# Patient Record
Sex: Female | Born: 1935
Health system: Southern US, Community
[De-identification: ages and names within clinical notes are randomized; demographics above are authoritative.]

## PROBLEM LIST (undated history)

## (undated) DIAGNOSIS — H3530 Unspecified macular degeneration: Secondary | ICD-10-CM

## (undated) DIAGNOSIS — I2699 Other pulmonary embolism without acute cor pulmonale: Secondary | ICD-10-CM

## (undated) DIAGNOSIS — H547 Unspecified visual loss: Secondary | ICD-10-CM

## (undated) DIAGNOSIS — N200 Calculus of kidney: Secondary | ICD-10-CM

## (undated) DIAGNOSIS — I8390 Asymptomatic varicose veins of unspecified lower extremity: Secondary | ICD-10-CM

## (undated) DIAGNOSIS — T8859XA Other complications of anesthesia, initial encounter: Secondary | ICD-10-CM

## (undated) DIAGNOSIS — M1990 Unspecified osteoarthritis, unspecified site: Secondary | ICD-10-CM

## (undated) DIAGNOSIS — Z9889 Other specified postprocedural states: Secondary | ICD-10-CM

## (undated) DIAGNOSIS — T4145XA Adverse effect of unspecified anesthetic, initial encounter: Secondary | ICD-10-CM

## (undated) DIAGNOSIS — R112 Nausea with vomiting, unspecified: Secondary | ICD-10-CM

## (undated) HISTORY — PX: EYE SURGERY: SHX253

## (undated) HISTORY — PX: KNEE ARTHROSCOPY: SUR90

---

## 2005-01-19 ENCOUNTER — Ambulatory Visit (HOSPITAL_COMMUNITY): Admission: RE | Admit: 2005-01-19 | Discharge: 2005-01-19 | Payer: Self-pay | Admitting: Family Medicine

## 2005-01-24 ENCOUNTER — Ambulatory Visit: Payer: Self-pay | Admitting: *Deleted

## 2005-02-07 ENCOUNTER — Ambulatory Visit (HOSPITAL_COMMUNITY): Admission: RE | Admit: 2005-02-07 | Discharge: 2005-02-07 | Payer: Self-pay | Admitting: *Deleted

## 2005-02-07 ENCOUNTER — Ambulatory Visit: Payer: Self-pay | Admitting: *Deleted

## 2005-02-07 ENCOUNTER — Ambulatory Visit: Payer: Self-pay | Admitting: Cardiology

## 2005-02-15 ENCOUNTER — Ambulatory Visit: Payer: Self-pay | Admitting: *Deleted

## 2005-02-21 ENCOUNTER — Ambulatory Visit (HOSPITAL_COMMUNITY): Admission: RE | Admit: 2005-02-21 | Discharge: 2005-02-21 | Payer: Self-pay | Admitting: *Deleted

## 2005-02-21 ENCOUNTER — Ambulatory Visit: Payer: Self-pay | Admitting: *Deleted

## 2005-02-22 ENCOUNTER — Ambulatory Visit: Payer: Self-pay | Admitting: *Deleted

## 2009-07-15 DIAGNOSIS — H3530 Unspecified macular degeneration: Secondary | ICD-10-CM | POA: Insufficient documentation

## 2009-07-15 DIAGNOSIS — R079 Chest pain, unspecified: Secondary | ICD-10-CM | POA: Insufficient documentation

## 2011-04-05 ENCOUNTER — Emergency Department (HOSPITAL_COMMUNITY): Payer: Medicare Other

## 2011-04-05 ENCOUNTER — Encounter (HOSPITAL_COMMUNITY): Payer: Self-pay | Admitting: Radiology

## 2011-04-05 ENCOUNTER — Emergency Department (HOSPITAL_COMMUNITY)
Admission: EM | Admit: 2011-04-05 | Discharge: 2011-04-06 | Disposition: A | Discharge status: Home / Self Care | Payer: Medicare Other | Attending: Emergency Medicine | Admitting: Emergency Medicine

## 2011-04-05 DIAGNOSIS — R197 Diarrhea, unspecified: Secondary | ICD-10-CM | POA: Insufficient documentation

## 2011-04-05 DIAGNOSIS — R5383 Other fatigue: Secondary | ICD-10-CM | POA: Insufficient documentation

## 2011-04-05 DIAGNOSIS — R5381 Other malaise: Secondary | ICD-10-CM | POA: Insufficient documentation

## 2011-04-05 DIAGNOSIS — N12 Tubulo-interstitial nephritis, not specified as acute or chronic: Secondary | ICD-10-CM | POA: Insufficient documentation

## 2011-04-05 DIAGNOSIS — I10 Essential (primary) hypertension: Secondary | ICD-10-CM | POA: Insufficient documentation

## 2011-04-05 DIAGNOSIS — R109 Unspecified abdominal pain: Secondary | ICD-10-CM | POA: Insufficient documentation

## 2011-04-05 DIAGNOSIS — R112 Nausea with vomiting, unspecified: Secondary | ICD-10-CM | POA: Insufficient documentation

## 2011-04-05 DIAGNOSIS — R350 Frequency of micturition: Secondary | ICD-10-CM | POA: Insufficient documentation

## 2011-04-05 LAB — DIFFERENTIAL
Eosinophils Relative: 0 % (ref 0–5)
Lymphocytes Relative: 14 % (ref 12–46)
Lymphs Abs: 1.3 10*3/uL (ref 0.7–4.0)
Monocytes Relative: 7 % (ref 3–12)

## 2011-04-05 LAB — URINALYSIS, ROUTINE W REFLEX MICROSCOPIC
Glucose, UA: NEGATIVE mg/dL
Protein, ur: 30 mg/dL — AB
Specific Gravity, Urine: 1.02 (ref 1.005–1.030)
pH: 6 (ref 5.0–8.0)

## 2011-04-05 LAB — CBC
Platelets: 195 10*3/uL (ref 150–400)
RBC: 4.54 MIL/uL (ref 3.87–5.11)
WBC: 9.1 10*3/uL (ref 4.0–10.5)

## 2011-04-05 LAB — URINE MICROSCOPIC-ADD ON

## 2011-04-05 LAB — COMPREHENSIVE METABOLIC PANEL
ALT: 7 U/L (ref 0–35)
BUN: 10 mg/dL (ref 6–23)
CO2: 32 mEq/L (ref 19–32)
Calcium: 9.8 mg/dL (ref 8.4–10.5)
Creatinine, Ser: 1.04 mg/dL (ref 0.4–1.2)
GFR calc Af Amer: >60 mL/min (ref >60)
GFR calc non Af Amer: 52 mL/min — ABNORMAL LOW (ref >60)
Glucose, Bld: 108 mg/dL — ABNORMAL HIGH (ref 70–99)
Sodium: 136 mEq/L (ref 135–145)
Total Protein: 8.1 g/dL (ref 6.0–8.3)

## 2011-04-05 LAB — LIPASE, BLOOD: Lipase: 13 U/L (ref 11–59)

## 2011-04-05 MED ORDER — IOHEXOL 300 MG/ML  SOLN
100.0000 mL | Freq: Once | INTRAMUSCULAR | Status: AC | PRN
  Administered 2011-04-05: 100 mL via INTRAVENOUS

## 2011-04-07 LAB — URINE CULTURE: Colony Count: 75000

## 2011-05-04 ENCOUNTER — Ambulatory Visit (HOSPITAL_COMMUNITY)
Admission: RE | Admit: 2011-05-04 | Discharge: 2011-05-04 | Disposition: A | Discharge status: Home / Self Care | Payer: Medicare Other | Source: Ambulatory Visit | Attending: Urology | Admitting: Urology

## 2011-05-04 ENCOUNTER — Encounter (HOSPITAL_COMMUNITY): Payer: Self-pay

## 2011-05-04 ENCOUNTER — Other Ambulatory Visit (HOSPITAL_COMMUNITY): Payer: Self-pay | Admitting: Urology

## 2011-05-04 DIAGNOSIS — R109 Unspecified abdominal pain: Secondary | ICD-10-CM | POA: Insufficient documentation

## 2011-05-04 DIAGNOSIS — N201 Calculus of ureter: Secondary | ICD-10-CM

## 2011-05-06 ENCOUNTER — Encounter (HOSPITAL_COMMUNITY): Payer: Self-pay

## 2011-05-06 ENCOUNTER — Encounter (HOSPITAL_COMMUNITY)
Admission: RE | Admit: 2011-05-06 | Discharge: 2011-05-06 | Disposition: A | Discharge status: Home / Self Care | Payer: Medicare Other | Source: Ambulatory Visit | Attending: Urology | Admitting: Urology

## 2011-05-06 ENCOUNTER — Other Ambulatory Visit: Payer: Self-pay

## 2011-05-06 HISTORY — DX: Unspecified macular degeneration: H35.30

## 2011-05-06 HISTORY — DX: Unspecified osteoarthritis, unspecified site: M19.90

## 2011-05-06 HISTORY — DX: Other complications of anesthesia, initial encounter: T88.59XA

## 2011-05-06 HISTORY — DX: Other specified postprocedural states: Z98.890

## 2011-05-06 HISTORY — DX: Adverse effect of unspecified anesthetic, initial encounter: T41.45XA

## 2011-05-06 HISTORY — DX: Asymptomatic varicose veins of unspecified lower extremity: I83.90

## 2011-05-06 HISTORY — DX: Nausea with vomiting, unspecified: R11.2

## 2011-05-06 LAB — DIFFERENTIAL
Basophils Absolute: 0 10*3/uL (ref 0.0–0.1)
Lymphocytes Relative: 33 % (ref 12–46)
Monocytes Absolute: 0.3 10*3/uL (ref 0.1–1.0)
Neutro Abs: 2.7 10*3/uL (ref 1.7–7.7)
Neutrophils Relative %: 59 % (ref 43–77)

## 2011-05-06 LAB — CBC
Hemoglobin: 13.7 g/dL (ref 12.0–15.0)
MCHC: 32.5 g/dL (ref 30.0–36.0)
RBC: 4.82 MIL/uL (ref 3.87–5.11)
WBC: 4.6 10*3/uL (ref 4.0–10.5)

## 2011-05-06 LAB — BASIC METABOLIC PANEL
BUN: 10 mg/dL (ref 6–23)
CO2: 28 mEq/L (ref 19–32)
Glucose, Bld: 113 mg/dL — ABNORMAL HIGH (ref 70–99)
Potassium: 3.7 mEq/L (ref 3.5–5.1)
Sodium: 140 mEq/L (ref 135–145)

## 2011-05-06 LAB — SURGICAL PCR SCREEN
MRSA, PCR: NEGATIVE
Staphylococcus aureus: NEGATIVE

## 2011-05-06 NOTE — Patient Instructions (Signed)
20 [name]  05/06/2011   Your procedure is scheduled on:  Tuesday, 05/10/11  Report to [facility] at 07:00 AM.  Call this number if you have problems the morning of surgery: [phone]   Remember:   Do not eat food:After Midnight.  Do not drink clear liquids: After Midnight.  Take these medicines the morning of surgery with A SIP OF WATER: none   Do not wear jewelry, make-up or nail polish.  Do not bring valuables to the hospital.  Contacts, dentures or bridgework may not be worn into surgery.  Leave suitcase in the car. After surgery it may be brought to your room.  For patients admitted to the hospital, checkout time is 11:00 AM the day of discharge.   Patients discharged the day of surgery will not be allowed to drive home.  Name and phone number of your driver: [name]  Special Instructions: CHG Shower Shower 2 days before surgery and 1 day before surgery with Hibiclens.   Please read over the following fact sheets that you were given: Pain Booklet, MRSA Information, Surgical Site Infection Prevention and Anesthesia Post-op Instructions    PATIENT INSTRUCTIONS POST-ANESTHESIA  IMMEDIATELY FOLLOWING SURGERY:  Do not drive or operate machinery for the first twenty four hours after surgery.  Do not make any important decisions for twenty four hours after surgery or while taking narcotic pain medications or sedatives.  If you develop intractable nausea and vomiting or a severe headache please notify your doctor immediately.  FOLLOW-UP:  Please make an appointment with your surgeon as instructed. You do not need to follow up with anesthesia unless specifically instructed to do so.  WOUND CARE INSTRUCTIONS (if applicable):  Keep a dry clean dressing on the anesthesia/puncture wound site if there is drainage.  Once the wound has quit draining you may leave it open to air.  Generally you should leave the bandage intact for twenty four hours unless there is drainage.  If the  epidural site drains for more than 36-48 hours please call the anesthesia department.  QUESTIONS?:  Please feel free to call your physician or the hospital operator if you have any questions, and they will be happy to assist you.     [facility] Anesthesia Department [address] [place] [phone]

## 2011-05-10 ENCOUNTER — Encounter (HOSPITAL_COMMUNITY): Payer: Self-pay | Admitting: Anesthesiology

## 2011-05-10 ENCOUNTER — Ambulatory Visit (HOSPITAL_COMMUNITY): Payer: Medicare Other

## 2011-05-10 ENCOUNTER — Encounter (HOSPITAL_COMMUNITY): Payer: Self-pay | Admitting: *Deleted

## 2011-05-10 ENCOUNTER — Encounter (HOSPITAL_COMMUNITY)
Admission: RE | Disposition: A | Discharge status: Home / Self Care | Payer: Self-pay | Source: Ambulatory Visit | Attending: Urology

## 2011-05-10 ENCOUNTER — Ambulatory Visit (HOSPITAL_COMMUNITY)
Admission: RE | Admit: 2011-05-10 | Discharge: 2011-05-10 | Disposition: A | Discharge status: Home / Self Care | Payer: Medicare Other | Source: Ambulatory Visit | Attending: Urology | Admitting: Urology

## 2011-05-10 ENCOUNTER — Ambulatory Visit (HOSPITAL_COMMUNITY): Payer: Medicare Other | Admitting: Anesthesiology

## 2011-05-10 DIAGNOSIS — N2889 Other specified disorders of kidney and ureter: Secondary | ICD-10-CM | POA: Insufficient documentation

## 2011-05-10 DIAGNOSIS — H3530 Unspecified macular degeneration: Secondary | ICD-10-CM

## 2011-05-10 DIAGNOSIS — R079 Chest pain, unspecified: Secondary | ICD-10-CM

## 2011-05-10 DIAGNOSIS — Z01810 Encounter for preprocedural cardiovascular examination: Secondary | ICD-10-CM | POA: Insufficient documentation

## 2011-05-10 DIAGNOSIS — Z01812 Encounter for preprocedural laboratory examination: Secondary | ICD-10-CM | POA: Insufficient documentation

## 2011-05-10 DIAGNOSIS — N201 Calculus of ureter: Secondary | ICD-10-CM | POA: Insufficient documentation

## 2011-05-10 HISTORY — PX: CYSTOSCOPY/RETROGRADE/URETEROSCOPY: SHX5316

## 2011-05-10 HISTORY — PX: CYSTOSCOPY W/ URETERAL STENT PLACEMENT: SHX1429

## 2011-05-10 SURGERY — CYSTOSCOPY/RETROGRADE/URETEROSCOPY
Anesthesia: General | Site: Ureter | Laterality: Bilateral | Wound class: Clean Contaminated

## 2011-05-10 MED ORDER — LACTATED RINGERS IV SOLN
INTRAVENOUS | Status: DC
  Administered 2011-05-10: 09:00:00 via INTRAVENOUS

## 2011-05-10 MED ORDER — ONDANSETRON HCL 4 MG/2ML IJ SOLN
INTRAMUSCULAR | Status: AC
  Administered 2011-05-10: 4 mg via INTRAVENOUS
  Filled 2011-05-10: qty 2

## 2011-05-10 MED ORDER — PROPOFOL 10 MG/ML IV EMUL
INTRAVENOUS | Status: AC
  Filled 2011-05-10: qty 20

## 2011-05-10 MED ORDER — OXYCODONE-ACETAMINOPHEN 7.5-500 MG PO TABS: 1.0000 | ORAL_TABLET | Freq: Four times a day (QID) | ORAL | Status: AC | PRN

## 2011-05-10 MED ORDER — MIDAZOLAM HCL 2 MG/2ML IJ SOLN
1.0000 mg | INTRAMUSCULAR | Status: DC | PRN
  Administered 2011-05-10: 2 mg via INTRAVENOUS

## 2011-05-10 MED ORDER — MIDAZOLAM HCL 2 MG/2ML IJ SOLN
INTRAMUSCULAR | Status: AC
  Administered 2011-05-10: 2 mg via INTRAVENOUS
  Filled 2011-05-10: qty 2

## 2011-05-10 MED ORDER — LACTATED RINGERS IV SOLN
INTRAVENOUS | Status: DC | PRN
  Administered 2011-05-10 (×2): via INTRAVENOUS

## 2011-05-10 MED ORDER — FENTANYL CITRATE 0.05 MG/ML IJ SOLN
INTRAMUSCULAR | Status: DC | PRN
  Administered 2011-05-10 (×4): 25 ug via INTRAVENOUS

## 2011-05-10 MED ORDER — FENTANYL CITRATE 0.05 MG/ML IJ SOLN
INTRAMUSCULAR | Status: AC
  Administered 2011-05-10: 25 ug via INTRAVENOUS
  Filled 2011-05-10: qty 2

## 2011-05-10 MED ORDER — MIDAZOLAM HCL 2 MG/2ML IJ SOLN
INTRAMUSCULAR | Status: AC
  Filled 2011-05-10: qty 2

## 2011-05-10 MED ORDER — MIDAZOLAM HCL 5 MG/5ML IJ SOLN
INTRAMUSCULAR | Status: DC | PRN
  Administered 2011-05-10: 1 mg via INTRAVENOUS

## 2011-05-10 MED ORDER — CIPROFLOXACIN IN D5W 400 MG/200ML IV SOLN
INTRAVENOUS | Status: DC | PRN
  Administered 2011-05-10: 200 mg via INTRAVENOUS

## 2011-05-10 MED ORDER — CIPROFLOXACIN IN D5W 200 MG/100ML IV SOLN: 200.0000 mg | Freq: Two times a day (BID) | INTRAVENOUS | Status: DC

## 2011-05-10 MED ORDER — CIPROFLOXACIN HCL 250 MG PO TABS: 250.0000 mg | ORAL_TABLET | Freq: Two times a day (BID) | ORAL | Status: AC

## 2011-05-10 MED ORDER — ONDANSETRON HCL 4 MG/2ML IJ SOLN: 4.0000 mg | Freq: Once | INTRAMUSCULAR | Status: DC | PRN

## 2011-05-10 MED ORDER — PROPOFOL 10 MG/ML IV EMUL
INTRAVENOUS | Status: DC | PRN
  Administered 2011-05-10: 30 mg via INTRAVENOUS
  Administered 2011-05-10: 120 mg via INTRAVENOUS
  Administered 2011-05-10: 50 mg via INTRAVENOUS

## 2011-05-10 MED ORDER — IOHEXOL 350 MG/ML SOLN
INTRAVENOUS | Status: DC | PRN
  Administered 2011-05-10: 50 mL

## 2011-05-10 MED ORDER — CIPROFLOXACIN IN D5W 200 MG/100ML IV SOLN
INTRAVENOUS | Status: AC
  Filled 2011-05-10: qty 100

## 2011-05-10 MED ORDER — ONDANSETRON HCL 4 MG/2ML IJ SOLN
4.0000 mg | Freq: Once | INTRAMUSCULAR | Status: AC
  Administered 2011-05-10: 4 mg via INTRAVENOUS

## 2011-05-10 MED ORDER — FENTANYL CITRATE 0.05 MG/ML IJ SOLN
25.0000 ug | INTRAMUSCULAR | Status: DC | PRN
  Administered 2011-05-10 (×2): 25 ug via INTRAVENOUS

## 2011-05-10 SURGICAL SUPPLY — 25 items
BAG DRAIN URO TABLE W/ADPT NS (DRAPE) ×3 IMPLANT
BAG DRN 8 ADPR NS SKTRN CSTL (DRAPE) ×2
CATH 5 FR WEDGE TIP (UROLOGICAL SUPPLIES) ×3 IMPLANT
CLOTH BEACON ORANGE TIMEOUT ST (SAFETY) ×3 IMPLANT
DECANTER SPIKE VIAL GLASS SM (MISCELLANEOUS) ×3 IMPLANT
DILATOR BALLN URETERAL SET (BALLOONS) ×1 IMPLANT
FLOOR PAD 36X40 (MISCELLANEOUS)
GLOVE BIO SURGEON STRL SZ7 (GLOVE) ×3 IMPLANT
GLOVE ECLIPSE 6.5 STRL STRAW (GLOVE) ×2 IMPLANT
GLOVE EXAM NITRILE MD LF STRL (GLOVE) ×1 IMPLANT
GLOVE INDICATOR 7.0 STRL GRN (GLOVE) ×2 IMPLANT
GOWN BRE IMP SLV AUR XL STRL (GOWN DISPOSABLE) ×3 IMPLANT
IV NS IRRIG 3000ML ARTHROMATIC (IV SOLUTION) ×7 IMPLANT
KIT ROOM TURNOVER AP CYSTO (KITS) ×3 IMPLANT
LASER FIBER DISP (UROLOGICAL SUPPLIES) IMPLANT
LASER FIBER DISP 1000U (UROLOGICAL SUPPLIES) IMPLANT
MANIFOLD NEPTUNE II (INSTRUMENTS) ×3 IMPLANT
PACK CYSTO (CUSTOM PROCEDURE TRAY) ×3 IMPLANT
PAD ARMBOARD 7.5X6 YLW CONV (MISCELLANEOUS) ×3 IMPLANT
PAD FLOOR 36X40 (MISCELLANEOUS) ×2 IMPLANT
STENT PERCUFLEX 4.8FRX24 (STENTS) ×2 IMPLANT
STONE RETRIEVAL GEMINI 2.4 FR (MISCELLANEOUS) ×2 IMPLANT
TOWEL OR 17X26 4PK STRL BLUE (TOWEL DISPOSABLE) ×3 IMPLANT
WATER STERILE IRR 1000ML POUR (IV SOLUTION) ×3 IMPLANT
WIRE GUIDE BENTSON .035 15CM (WIRE) ×4 IMPLANT

## 2011-05-10 NOTE — H&P (Signed)
NAME:  Heather Miller, Heather Miller                  ACCOUNT NO.:  [id number]  MEDICAL RECORD NO.:  [id number]  LOCATION:                                 FACILITY:  PHYSICIAN:  Ky Barban, M.D.DATE OF BIRTH:  1936/09/29  DATE OF ADMISSION: DATE OF DISCHARGE:  LH                             HISTORY & PHYSICAL   This patient is coming tomorrow to undergo a retrograde pyelogram.  HISTORY:  This 75 years old female had an episode of suprapubic pain, which went around her left flank.  She had to go to the emergency room Saturday, but she was feeling fine the day when I a saw her in the office on April 13, 2011.  The first time she had this pain was 4-5 months ago.  No history of fever, chills, or gross hematuria.  No history of kidney stones.  At this time, the CT scan in the ER showed hydronephrosis on the left side with possible left UPJ obstruction. There is a possibility of double ureter with an ectopic ureter on that side with ureterocele and a stone on that side in the ureterocele.  When I did a KUB in the office, I suspect there is some calcification in the right side in the right distal ureter.  Also, I did a cystoscopy in the office.  I can see a ureterocele on the right side and that might have stones in it.  So, I have suggested the patient to undergo bilateral retrograde pyelogram, possible ureteroscopy.  She will be done under anesthesia as outpatient.  PAST MEDICAL HISTORY:  No history of diabetes or hypertension. Arthroscopic surgery of right knee 5 years ago.  She also has a history of having cardiac problem, but not actively now.  PERSONAL HISTORY:  Does not smoke or drink.  REVIEW OF SYSTEMS:  Unremarkable.  Denies any chest pain, orthopnea, PND, nausea, vomiting.  PHYSICAL EXAMINATION:  VITAL SIGNS:  Blood pressure 126/83, temperature 97.4. CENTRAL NERVOUS SYSTEM:  No gross neurological deficit. HEAD, NECK, EYES:  Negative. CHEST:  Symmetrical. HEART:  Regular sinus  rhythm.  No murmur. ABDOMEN:  Soft, flat.  Liver, spleen, kidneys are not palpable.  No CVA tenderness. PELVIC:  No adnexal mass or tenderness.  IMPRESSION:  Possible right ureterocele with a stone, left ureteropelvic junction obstruction with hydronephrosis.  PLAN:  Cystoscopy, bilateral retrograde pyelogram, bilateral ureteroscopy under anesthesia as an outpatient.     Ky Barban, M.D.     MIJ/MEDQ  D:  05/09/2011  T:  05/10/2011  Job:  [id number]

## 2011-05-10 NOTE — Anesthesia Preprocedure Evaluation (Addendum)
Anesthesia Evaluation  Name, MR# and DOB Patient awake  General Assessment Comment  History of Anesthesia Complications (+) PONV  Airway Mallampati: II  Neck ROM: Full    Dental  (+) Edentulous Upper and Edentulous Lower   Pulmonary  clear to auscultation    Cardiovascular Regular Normal   Neuro/Psych  GI/Hepatic/Renal bilat ureteral obstruction  Endo/Other   Abdominal   Musculoskeletal  Hematology   Peds  Reproductive/Obstetrics   Anesthesia Other Findings             Anesthesia Physical Anesthesia Plan  ASA: II  Anesthesia Plan: General   Post-op Pain Management:    Induction: Intravenous  Airway Management Planned: LMA  Additional Equipment:   Intra-op Plan:   Post-operative Plan:   Informed Consent: I have reviewed the patients History and Physical, chart, labs and discussed the procedure including the risks, benefits and alternatives for the proposed anesthesia with the patient or authorized representative who has indicated his/her understanding and acceptance.     Plan Discussed with:   Anesthesia Plan Comments:         Anesthesia Quick Evaluation

## 2011-05-10 NOTE — Anesthesia Postprocedure Evaluation (Signed)
  Anesthesia Post-op Note  Patient: Heather Miller  Procedure(s) Performed:  CYSTOSCOPY/RETROGRADE/URETEROSCOPY - no stone on right side; CYSTOSCOPY WITH STENT REPLACEMENT  Patient Location: PACU  Anesthesia Type: General  Level of Consciousness: awake, alert , oriented and patient cooperative  Airway and Oxygen Therapy: Patient Spontanous Breathing and Patient connected to face mask oxygen  Post-op Pain: 3 /10, mild  Post-op Assessment: Post-op Vital signs reviewed, Patient's Cardiovascular Status Stable, Respiratory Function Stable, No signs of Nausea or vomiting and Pain level controlled  Post-op Vital Signs: stable  Complications: No apparent anesthesia complications

## 2011-05-10 NOTE — H&P (Signed)
  H&p dictated no change in H&p.

## 2011-05-10 NOTE — Anesthesia Procedure Notes (Addendum)
Procedure Name: LMA Insertion Date/Time: 05/10/2011 9:32 AM Performed by: Carolyne Littles, Jakoby Melendrez Pre-anesthesia Checklist: Patient identified, Patient being monitored, Emergency Drugs available and Timeout performed Patient Re-evaluated:Patient Re-evaluated prior to inductionOxygen Delivery Method: Circle System Utilized Preoxygenation: Pre-oxygenation with 100% oxygen Intubation Type: IV induction Ventilation: Mask ventilation without difficulty LMA: LMA inserted LMA Size: 4.0    Performed by: Carolyne Littles, Aaryanna Hyden    Performed by: Carolyne Littles, Quinntin Malter

## 2011-05-10 NOTE — Transfer of Care (Signed)
Immediate Anesthesia Transfer of Care Note  Patient: Heather Miller  Procedure(s) Performed:  CYSTOSCOPY/RETROGRADE/URETEROSCOPY - no stone on right side; CYSTOSCOPY WITH STENT REPLACEMENT  Patient Location: PACU  Anesthesia Type: General  Level of Consciousness: awake, alert , oriented and patient cooperative  Airway & Oxygen Therapy: Patient Spontanous Breathing and Patient connected to face mask oxygen  Post-op Assessment: Report given to PACU RN, Post -op Vital signs reviewed and stable and Patient moving all extremities  Post vital signs: stable  Complications: No apparent anesthesia complications

## 2011-05-18 ENCOUNTER — Encounter (HOSPITAL_COMMUNITY): Payer: Self-pay | Admitting: Urology

## 2011-05-18 NOTE — Op Note (Signed)
Report# [id number]

## 2011-05-19 NOTE — Op Note (Signed)
NAMESTEFFANY, SCHOENFELDER                  ACCOUNT NO.:  [id number]  MEDICAL RECORD NO.:  [id number]  LOCATION:  [facility]                          FACILITY:  [facility]  PHYSICIAN:  Ky Barban, M.D.DATE OF BIRTH:  [birth year]  DATE OF PROCEDURE: DATE OF DISCHARGE:  05/10/2011                              OPERATIVE REPORT   The patient had an episode of left renal colic.  CT scan showed, there is possible obstruction of the left UPJ, questionable distal left ureterocele but on KUB, I can see some calcification in the area of the bladder.  Cystoscopy in the office, I suspect that she may have ureterocele on the right side with stones, so I want to do a bilateral retrograde pyelogram to find this problem.  She was given spinal anesthesia in lithotomy position after usual prep and drape, #25 cystoscope was introduced into the bladder and left ureteral orifice was catheterized with a wedge catheter.  Hypaque was injected under fluoroscopic control and dye is injected and I do not see any UPJ obstruction, and there is no stone.  The ureter looks normal.  There is no duplication of the ureters.  There was no ureterocele on the side. Now, I put a wedge catheter in the right ureteral orifice and Hypaque was injected in the right side.  Again, the right retrograde pyelogram was also completely normal.  There is no stone or ureterocele.  I decided to leave a double-J stent on each side.  The guidewire is passed up into the right renal pelvis over the guide wire a 5-French double-J stent without string was introduced, positioned between the renal pelvis and the bladder.  Similarly on the left side, 5-French 24-cm double-J stent was positioned between the renal pelvis and the bladder without the string.  All the instruments were removed.  The patient left the operating room in satisfactory condition.     Ky Barban, M.D.     MIJ/MEDQ  D:  05/18/2011  T:  05/19/2011  Job:  [id number]

## 2011-12-17 ENCOUNTER — Emergency Department (HOSPITAL_COMMUNITY)
Admission: EM | Admit: 2011-12-17 | Discharge: 2011-12-17 | Disposition: A | Discharge status: Home / Self Care | Payer: Medicare Other | Attending: Emergency Medicine | Admitting: Emergency Medicine

## 2011-12-17 ENCOUNTER — Emergency Department (HOSPITAL_COMMUNITY): Payer: Medicare Other

## 2011-12-17 ENCOUNTER — Other Ambulatory Visit: Payer: Self-pay

## 2011-12-17 ENCOUNTER — Encounter (HOSPITAL_COMMUNITY): Payer: Self-pay | Admitting: Emergency Medicine

## 2011-12-17 DIAGNOSIS — R000 Tachycardia, unspecified: Secondary | ICD-10-CM

## 2011-12-17 DIAGNOSIS — R0602 Shortness of breath: Secondary | ICD-10-CM

## 2011-12-17 HISTORY — DX: Calculus of kidney: N20.0

## 2011-12-17 LAB — CBC
HCT: 39.9 % (ref 36.0–46.0)
Hemoglobin: 13.1 g/dL (ref 12.0–15.0)
MCHC: 32.8 g/dL (ref 30.0–36.0)
MCV: 84.7 fL (ref 78.0–100.0)
RDW: 14.7 % (ref 11.5–15.5)

## 2011-12-17 LAB — BASIC METABOLIC PANEL
BUN: 9 mg/dL (ref 6–23)
Creatinine, Ser: 0.9 mg/dL (ref 0.50–1.10)
GFR calc Af Amer: 72 mL/min — ABNORMAL LOW (ref >90)
GFR calc non Af Amer: 62 mL/min — ABNORMAL LOW (ref >90)
Glucose, Bld: 221 mg/dL — ABNORMAL HIGH (ref 70–99)

## 2011-12-17 MED ORDER — POTASSIUM CHLORIDE CRYS ER 20 MEQ PO TBCR
60.0000 meq | EXTENDED_RELEASE_TABLET | Freq: Once | ORAL | Status: AC
  Administered 2011-12-17: 60 meq via ORAL
  Filled 2011-12-17: qty 3

## 2011-12-17 MED ORDER — DILTIAZEM HCL 25 MG/5ML IV SOLN
10.0000 mg | Freq: Once | INTRAVENOUS | Status: AC
  Administered 2011-12-17: 10 mg via INTRAVENOUS
  Filled 2011-12-17: qty 5

## 2011-12-17 MED ORDER — METOPROLOL TARTRATE 1 MG/ML IV SOLN
5.0000 mg | Freq: Once | INTRAVENOUS | Status: AC
  Administered 2011-12-17: 5 mg via INTRAVENOUS
  Filled 2011-12-17: qty 5

## 2011-12-17 NOTE — ED Provider Notes (Signed)
History     CSN: [id number]  Arrival date & time 12/17/11  4098   First MD Initiated Contact with Patient 12/17/11 [phone]      Chief Complaint  Patient presents with  . Shortness of Breath    (Consider location/radiation/quality/duration/timing/severity/associated sxs/prior treatment) HPI Heather Miller is a 76 y.o. female brought in by ambulance, who presents to the Emergency Department complaining of shortness of breath with minimal exertion hat began tonight when she got up to go to the bathroom and get ready for bed. Heart began to race and she felt short of breath. States she has had two previous episodes, one a month ago and one last week. Both previous episodes only lasted for a few minutes and her heart slowed down and the shortness of breath resolved. This time the racing of her heart continued. It was not associated with chest pain, nausea, coughing, fever, or chills. Continues to feel short of breath. In 2006 she was evaluated by [facility] cardiology for chest pain. 2D Echo and  Nuclear stress test were negative. In 2012 she experienced her first kidney stone that required surgery for retrieval and stent placement by Dr. Jerre Simon.    PCP Dr. Renard Matter Urology: Dr. Jerre Simon Past Medical History  Diagnosis Date  . Varicose vein of leg   . Arthritis   . Complication of anesthesia   . PONV (postoperative nausea and vomiting)   . Macular degeneration   . Kidney stones     Past Surgical History  Procedure Date  . Knee arthroscopy     Right, [facility]  . Eye surgery 50's    detached retina  . Cystoscopy/retrograde/ureteroscopy 05/10/2011    Procedure: CYSTOSCOPY/RETROGRADE/URETEROSCOPY;  Surgeon: Ky Barban;  Location: [facility];  Service: Urology;  Laterality: Bilateral;  no stone on right side  . Cystoscopy w/ ureteral stent placement 05/10/2011    Procedure: CYSTOSCOPY WITH STENT REPLACEMENT;  Surgeon: Ky Barban;  Location: [facility];  Service: Urology;  Laterality:  Bilateral;    No family history on file.  History  Substance Use Topics  . Smoking status: Never Smoker   . Smokeless tobacco: Not on file  . Alcohol Use: No    OB History    Grav Para Term Preterm Abortions TAB SAB Ect Mult Living                  Review of Systems A 10 review of systems reviewed and are negative for acute change except as noted in the HPI. Allergies  Review of patient's allergies indicates no known allergies.  Home Medications   Current Outpatient Rx  Name Route Sig Dispense Refill  . OXYCODONE-ACETAMINOPHEN 7.5-500 MG PO TABS Oral Take 1 tablet by mouth every 6 (six) hours as needed for pain. 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  No gross focal motor or sensory deficits in extremities.; Skin: Color normal, Warm, Dry  ED Course  Procedures (including critical care time) Results for orders placed during the hospital encounter of 12/17/11  CBC      Component Value Range   WBC 9.8  4.0 - 10.5 (K/uL)   RBC 4.71  3.87 - 5.11  (MIL/uL)   Hemoglobin 13.1  12.0 - 15.0 (g/dL)   HCT 02.7  25.3 - 66.4 (%)   MCV 84.7  78.0 - 100.0 (fL)   MCH 27.8  26.0 - 34.0 (pg)   MCHC 32.8  30.0 - 36.0 (g/dL)   RDW 40.3  47.4 - 25.9 (%)   Platelets 200  150 - 400 (K/uL)  BASIC METABOLIC PANEL      Component Value Range   Sodium 138  135 - 145 (mEq/L)   Potassium 2.6 (*) 3.5 - 5.1 (mEq/L)   Chloride 100  96 - 112 (mEq/L)   CO2 23  19 - 32 (mEq/L)   Glucose, Bld 221 (*) 70 - 99 (mg/dL)   BUN 9  6 - 23 (mg/dL)   Creatinine, Ser 5.63  0.50 - 1.10 (mg/dL)   Calcium 9.3  8.4 - 87.5 (mg/dL)   GFR calc non Af Amer 62 (*) >90 (mL/min)   GFR calc Af Amer 72 (*) >90 (mL/min)  TROPONIN I      Component Value Range   Troponin I <0.30  <0.30 (ng/mL)   Post Cardizem  10 mg IV administration, pre medication rate was 133.   Date: 12/17/2011  0425  Rate: 115  Rhythm: accelerated juctional rhythm with retrgrade conduction  QRS Axis: left  Intervals: normal  ST/T Wave abnormalities: nonspecific ST changes  Conduction Disutrbances:none  Narrative Interpretation:   Old EKG Reviewed: none available  Post metoprolol   Date: 12/17/2011  6433  Rate: 91  Rhythm: normal sinus rhythm  QRS Axis: left  Intervals: normal  ST/T Wave abnormalities: nonspecific T wave changes  Conduction Disutrbances:left anterior fascicular block  Narrative Interpretation:   Old EKG Reviewed: changes noted c/w previous    Dg Chest Port 1 View  12/17/2011  *RADIOLOGY REPORT*  Clinical Data: Shortness of breath.  PORTABLE CHEST - 1 VIEW  Comparison: None  Findings: Mild cardiomegaly.  Lungs are clear.  No effusions.  No acute bony abnormality.  IMPRESSION: Cardiomegaly.  No active disease.  Original Report Authenticated By: Cyndie Chime, M.D.   [phone] Patient and daughter informed of clinical course, understand medical decision-making process, and agree with plan.Dx testing d/w pt and family.  Questions answered.  Verb understanding, agreeable to d/c home  with outpt f/u.    MDM  Patient arrived with shortness of breath and heart rate of 133. Given Cardizem with decrease in heart rate to 115. Additionally given metoprolol with decrease in HR to 89. Shortness of breath resolved with decrease in HR. Labs with hypokalemia which was repleted. Patient received IVF. Pt feels improved after observation and/or treatment in ED.Pt stable in ED with no significant deterioration in condition.The patient appears reasonably screened and/or stabilized for discharge and I doubt any other medical condition or other [facility] requiring further screening, evaluation, or treatment in the ED at this time prior to discharge.  MDM Reviewed: previous chart, nursing note and vitals Reviewed  previous: labs Interpretation: labs, ECG and x-ray Total time providing critical care: 40.      Nicoletta Dress. Colon Branch, MD 12/17/11 [phone]

## 2011-12-17 NOTE — ED Notes (Signed)
Patient reports having shortness of breath that started when she got up to go to the restroom. States that she has had this problem a couple of times before, but has not seen a doctor. Denies pain. States she gets short of breath when moving around.

## 2011-12-17 NOTE — ED Notes (Signed)
Breakfast tray given to pt, and family member.  Resting in bed.

## 2011-12-17 NOTE — Discharge Instructions (Signed)
YOUR HEART NUMBERS AND CHEST XRAY WERE NORMAL HERE TONIGHT. YOUR BLOOD WORK SHOWED A LOW POTASSIUM. WE GAVE YOU POTASSIUM WHILE YOU WERE HERE. YOUR EKG SHOWED A FAST RATE WHICH WE SLOWED DOWN WITH MEDICINES. CONTINUE TAKING YOUR CURRENT MEDICINES. FOLLOW UP WITH YOUR DOCTOR.   Cardiac Arrhythmia Your heart is a muscle that works to pump blood through your body by regular contractions. The beating of your heart is controlled by a system of special pacemaker cells. These cells control the electrical activity of the heart. When the system controlling this regular beating is disturbed, a heart rhythm abnormality (arrhythmia) results. WHEN YOUR HEART SKIPS A BEAT One of the most common and least serious heart arrhythmias is called an ectopic or premature atrial heartbeat (PAC). This may be noticed as a small change in your regular pulse. A PAC originates from the top part (atrium) of the heart. Within the right atrium, the SA node is the area that normally controls the regularity of the heart. PACs occur in heart tissue outside of the SA node region. You may feel this as a skipped beat or heart flutter, especially if several occur in succession or occur frequently.  Another arrhythmia is ventricular premature complex (VCP or PVC). These extra beats start out in the bottom, more muscular chambers of the heart. In most cases a PVC is harmless. If there are underlying causes that are making the heart irritable such as an overactive thyroid or a prior heart attack PVCs may be of more concern. In a few cases, medications to control the heart rhythm may be prescribed. Things to try at home:  Cut down or avoid alcohol, tobacco and caffeine.   Get enough sleep.   Reduce stress.   Exercise more.  WHEN THE HEART BEATS TOO FAST Atrial tachycardia is a fast heart rate, which starts out in the atrium. It may last from minutes to much longer. Your heart may beat 140 to 240 times per minute instead of the normal 60 to  100.  Symptoms include a worried feeling (anxiety) and a sense that your heart is beating fast and hard.   You may be able to stop the fast rate by holding your breath or bearing down as if you were going to have a bowel movement.   This type of fast rate is usually not dangerous.  Atrial fibrillation and atrial flutter are other fast rhythms that start in the atria. Both conditions keep the atria from filling with enough blood so the heart does not work well.  Symptoms include feeling light-headed or faint.   These fast rates may be the result of heart damage or disease. Too much thyroid hormone may play a role.   There may be no clear cause or it may be from heart disease or damage.   Medication or a special electrical treatment (cardioversion) may be needed to get the heart beating normally.  Ventricular tachycardia is a fast heart rate that starts in the lower muscular chambers (ventricles) This is a serious disorder that requires treatment as soon as possible. You need someone else to get and use a small defibrillator.  Symptoms include collapse, chest pain, or being short of breath.   Treatment may include medication, procedures to improve blood flow to the heart, or an implantable cardiac defibrillator (ICD).  DIAGNOSIS   A cardiogram (EKG or ECG) will be done to see the arrhythmia, as well as lab tests to check the underlying cause.   If the extra  beats or fast rate come and go, you may wear a Holter monitor that records your heart rate for a longer period of time.  SEEK MEDICAL CARE IF:  You have irregular or fast heartbeats (palpitations).   You experience skipped beats.   You develop lightheadedness.   You have chest discomfort.   You have shortness of breath.   You have more frequent episodes, if you are already being treated.  SEEK IMMEDIATE MEDICAL CARE IF:   You have severe chest pain, especially if the pain is crushing or pressure-like and spreads to the arms,  back, neck, or jaw, or if you have sweating, feeling sick to your stomach (nausea), or shortness of breath. THIS IS AN EMERGENCY. Do not wait to see if the pain will go away. Get medical help at once. Call 911 or 0 (operator). DO NOT drive yourself to the hospital.   You feel dizzy or faint.   You have episodes of previously documented atrial tachycardia that do not resolve with the techniques your caregiver has taught you.   Irregular or rapid heartbeats begin to occur more often than in the past, especially if they are associated with more pronounced symptoms or of longer duration.  Document Released: 10/10/2005 Document Revised: 06/22/2011 Document Reviewed: 05/28/2008 [facility] Patient Information 2012 [place], [place].

## 2012-01-06 ENCOUNTER — Other Ambulatory Visit (HOSPITAL_COMMUNITY): Payer: Self-pay | Admitting: Urology

## 2012-01-06 DIAGNOSIS — N1330 Unspecified hydronephrosis: Secondary | ICD-10-CM

## 2012-01-11 ENCOUNTER — Ambulatory Visit (HOSPITAL_COMMUNITY)
Admission: RE | Admit: 2012-01-11 | Discharge: 2012-01-11 | Disposition: A | Discharge status: Home / Self Care | Payer: Medicare Other | Source: Ambulatory Visit | Attending: Urology | Admitting: Urology

## 2012-01-11 DIAGNOSIS — Q619 Cystic kidney disease, unspecified: Secondary | ICD-10-CM | POA: Insufficient documentation

## 2012-01-11 DIAGNOSIS — N1330 Unspecified hydronephrosis: Secondary | ICD-10-CM | POA: Insufficient documentation

## 2016-11-11 ENCOUNTER — Emergency Department (HOSPITAL_COMMUNITY): Payer: Medicare Other

## 2016-11-11 ENCOUNTER — Inpatient Hospital Stay (HOSPITAL_COMMUNITY)
Admission: EM | Admit: 2016-11-11 | Discharge: 2016-11-25 | DRG: 291 | Disposition: A | Discharge status: Skilled Nursing Facility | Payer: Medicare Other | Attending: Family Medicine | Admitting: Family Medicine

## 2016-11-11 ENCOUNTER — Encounter (HOSPITAL_COMMUNITY): Payer: Self-pay | Admitting: Emergency Medicine

## 2016-11-11 DIAGNOSIS — J9601 Acute respiratory failure with hypoxia: Secondary | ICD-10-CM | POA: Diagnosis present

## 2016-11-11 DIAGNOSIS — J181 Lobar pneumonia, unspecified organism: Secondary | ICD-10-CM | POA: Diagnosis not present

## 2016-11-11 DIAGNOSIS — J9620 Acute and chronic respiratory failure, unspecified whether with hypoxia or hypercapnia: Secondary | ICD-10-CM

## 2016-11-11 DIAGNOSIS — R0602 Shortness of breath: Secondary | ICD-10-CM | POA: Diagnosis not present

## 2016-11-11 DIAGNOSIS — E876 Hypokalemia: Secondary | ICD-10-CM | POA: Diagnosis present

## 2016-11-11 DIAGNOSIS — Z7982 Long term (current) use of aspirin: Secondary | ICD-10-CM

## 2016-11-11 DIAGNOSIS — R0902 Hypoxemia: Secondary | ICD-10-CM

## 2016-11-11 DIAGNOSIS — R2689 Other abnormalities of gait and mobility: Secondary | ICD-10-CM

## 2016-11-11 DIAGNOSIS — I5031 Acute diastolic (congestive) heart failure: Secondary | ICD-10-CM | POA: Diagnosis present

## 2016-11-11 DIAGNOSIS — Z23 Encounter for immunization: Secondary | ICD-10-CM

## 2016-11-11 DIAGNOSIS — I252 Old myocardial infarction: Secondary | ICD-10-CM

## 2016-11-11 DIAGNOSIS — H3530 Unspecified macular degeneration: Secondary | ICD-10-CM | POA: Diagnosis present

## 2016-11-11 DIAGNOSIS — Z79899 Other long term (current) drug therapy: Secondary | ICD-10-CM

## 2016-11-11 DIAGNOSIS — J189 Pneumonia, unspecified organism: Secondary | ICD-10-CM | POA: Diagnosis present

## 2016-11-11 DIAGNOSIS — I509 Heart failure, unspecified: Secondary | ICD-10-CM

## 2016-11-11 DIAGNOSIS — M6281 Muscle weakness (generalized): Secondary | ICD-10-CM

## 2016-11-11 DIAGNOSIS — I110 Hypertensive heart disease with heart failure: Principal | ICD-10-CM | POA: Diagnosis present

## 2016-11-11 DIAGNOSIS — J9690 Respiratory failure, unspecified, unspecified whether with hypoxia or hypercapnia: Secondary | ICD-10-CM

## 2016-11-11 DIAGNOSIS — J440 Chronic obstructive pulmonary disease with acute lower respiratory infection: Secondary | ICD-10-CM | POA: Diagnosis present

## 2016-11-11 DIAGNOSIS — N179 Acute kidney failure, unspecified: Secondary | ICD-10-CM | POA: Diagnosis present

## 2016-11-11 DIAGNOSIS — J449 Chronic obstructive pulmonary disease, unspecified: Secondary | ICD-10-CM | POA: Diagnosis present

## 2016-11-11 DIAGNOSIS — H547 Unspecified visual loss: Secondary | ICD-10-CM | POA: Diagnosis present

## 2016-11-11 HISTORY — DX: Unspecified visual loss: H54.7

## 2016-11-11 LAB — CBC WITH DIFFERENTIAL/PLATELET
BASOS ABS: 0 10*3/uL (ref 0.0–0.1)
BASOS PCT: 0 %
EOS PCT: 0 %
Eosinophils Absolute: 0 10*3/uL (ref 0.0–0.7)
HEMATOCRIT: 38.6 % (ref 36.0–46.0)
Hemoglobin: 12.6 g/dL (ref 12.0–15.0)
LYMPHS PCT: 4 %
Lymphs Abs: 0.6 10*3/uL — ABNORMAL LOW (ref 0.7–4.0)
MCH: 27.2 pg (ref 26.0–34.0)
MCHC: 32.6 g/dL (ref 30.0–36.0)
MCV: 83.4 fL (ref 78.0–100.0)
MONOS PCT: 6 %
Monocytes Absolute: 0.8 10*3/uL (ref 0.1–1.0)
NEUTROS ABS: 12.3 10*3/uL — AB (ref 1.7–7.7)
Neutrophils Relative %: 90 %
PLATELETS: 401 10*3/uL — AB (ref 150–400)
RBC: 4.63 MIL/uL (ref 3.87–5.11)
RDW: 14 % (ref 11.5–15.5)
WBC: 13.7 10*3/uL — ABNORMAL HIGH (ref 4.0–10.5)

## 2016-11-11 LAB — BASIC METABOLIC PANEL
Anion gap: 11 (ref 5–15)
BUN: 10 mg/dL (ref 6–20)
CALCIUM: 8.7 mg/dL — AB (ref 8.9–10.3)
CO2: 27 mmol/L (ref 22–32)
Chloride: 95 mmol/L — ABNORMAL LOW (ref 101–111)
Creatinine, Ser: 0.81 mg/dL (ref 0.44–1.00)
GFR calc Af Amer: >60 mL/min (ref >60)
GLUCOSE: 136 mg/dL — AB (ref 65–99)
Potassium: 3.3 mmol/L — ABNORMAL LOW (ref 3.5–5.1)
Sodium: 133 mmol/L — ABNORMAL LOW (ref 135–145)

## 2016-11-11 LAB — TROPONIN I: Troponin I: <0.03 ng/mL (ref <0.03)

## 2016-11-11 LAB — PROCALCITONIN: Procalcitonin: 1.57 ng/mL

## 2016-11-11 LAB — LACTIC ACID, PLASMA
Lactic Acid, Venous: 1.6 mmol/L (ref 0.5–1.9)
Lactic Acid, Venous: 1.4 mmol/L (ref 0.5–1.9)

## 2016-11-11 LAB — BRAIN NATRIURETIC PEPTIDE: B Natriuretic Peptide: 74 pg/mL (ref 0.0–100.0)

## 2016-11-11 MED ORDER — FUROSEMIDE 10 MG/ML IJ SOLN
40.0000 mg | Freq: Two times a day (BID) | INTRAMUSCULAR | Status: DC
  Administered 2016-11-12 – 2016-11-14 (×5): 40 mg via INTRAVENOUS
  Filled 2016-11-11 (×5): qty 4

## 2016-11-11 MED ORDER — PNEUMOCOCCAL VAC POLYVALENT 25 MCG/0.5ML IJ INJ
0.5000 mL | INJECTION | INTRAMUSCULAR | Status: AC
  Administered 2016-11-18: 0.5 mL via INTRAMUSCULAR
  Filled 2016-11-11 (×3): qty 0.5

## 2016-11-11 MED ORDER — INFLUENZA VAC SPLIT QUAD 0.5 ML IM SUSY
0.5000 mL | PREFILLED_SYRINGE | INTRAMUSCULAR | Status: AC
  Administered 2016-11-18: 0.5 mL via INTRAMUSCULAR
  Filled 2016-11-11 (×3): qty 0.5

## 2016-11-11 MED ORDER — ASPIRIN EC 81 MG PO TBEC
81.0000 mg | DELAYED_RELEASE_TABLET | Freq: Every day | ORAL | Status: DC
  Administered 2016-11-11 – 2016-11-25 (×15): 81 mg via ORAL
  Filled 2016-11-11 (×15): qty 1

## 2016-11-11 MED ORDER — LEVOFLOXACIN IN D5W 750 MG/150ML IV SOLN
750.0000 mg | Freq: Once | INTRAVENOUS | Status: AC
  Administered 2016-11-11: 750 mg via INTRAVENOUS
  Filled 2016-11-11: qty 150

## 2016-11-11 MED ORDER — ENOXAPARIN SODIUM 40 MG/0.4ML ~~LOC~~ SOLN
40.0000 mg | SUBCUTANEOUS | Status: DC
  Administered 2016-11-11 – 2016-11-24 (×14): 40 mg via SUBCUTANEOUS
  Filled 2016-11-11 (×14): qty 0.4

## 2016-11-11 MED ORDER — SODIUM CHLORIDE 0.9 % IV SOLN: 250.0000 mL | INTRAVENOUS | Status: DC | PRN

## 2016-11-11 MED ORDER — LEVOFLOXACIN 750 MG PO TABS
750.0000 mg | ORAL_TABLET | ORAL | Status: AC
  Administered 2016-11-12 – 2016-11-16 (×5): 750 mg via ORAL
  Filled 2016-11-11 (×5): qty 1

## 2016-11-11 MED ORDER — POTASSIUM CHLORIDE CRYS ER 20 MEQ PO TBCR
40.0000 meq | EXTENDED_RELEASE_TABLET | Freq: Two times a day (BID) | ORAL | Status: DC
  Administered 2016-11-11 – 2016-11-16 (×10): 40 meq via ORAL
  Filled 2016-11-11 (×10): qty 2

## 2016-11-11 MED ORDER — SODIUM CHLORIDE 0.9% FLUSH: 3.0000 mL | INTRAVENOUS | Status: DC | PRN

## 2016-11-11 MED ORDER — ALBUTEROL SULFATE (2.5 MG/3ML) 0.083% IN NEBU: 2.5000 mg | INHALATION_SOLUTION | RESPIRATORY_TRACT | Status: AC | PRN

## 2016-11-11 MED ORDER — ONDANSETRON HCL 4 MG/2ML IJ SOLN: 4.0000 mg | Freq: Four times a day (QID) | INTRAMUSCULAR | Status: DC | PRN

## 2016-11-11 MED ORDER — ACETAMINOPHEN 325 MG PO TABS
650.0000 mg | ORAL_TABLET | ORAL | Status: DC | PRN
  Administered 2016-11-23 – 2016-11-25 (×2): 650 mg via ORAL
  Filled 2016-11-11 (×2): qty 2

## 2016-11-11 MED ORDER — LEVALBUTEROL HCL 0.63 MG/3ML IN NEBU
0.6300 mg | INHALATION_SOLUTION | Freq: Once | RESPIRATORY_TRACT | Status: AC
  Administered 2016-11-11: 0.63 mg via RESPIRATORY_TRACT
  Filled 2016-11-11: qty 3

## 2016-11-11 MED ORDER — IPRATROPIUM BROMIDE 0.02 % IN SOLN
0.5000 mg | Freq: Once | RESPIRATORY_TRACT | Status: AC
  Administered 2016-11-11: 0.5 mg via RESPIRATORY_TRACT
  Filled 2016-11-11: qty 2.5

## 2016-11-11 MED ORDER — SODIUM CHLORIDE 0.9% FLUSH
3.0000 mL | Freq: Two times a day (BID) | INTRAVENOUS | Status: DC
  Administered 2016-11-11 – 2016-11-25 (×23): 3 mL via INTRAVENOUS

## 2016-11-11 MED ORDER — FUROSEMIDE 10 MG/ML IJ SOLN
40.0000 mg | Freq: Once | INTRAMUSCULAR | Status: AC
  Administered 2016-11-11: 40 mg via INTRAVENOUS
  Filled 2016-11-11: qty 4

## 2016-11-11 NOTE — H&P (Addendum)
History and Physical  PARUL PORCELLI ZOX:[id number] DOB: [birth year] DOA: 11/11/2016  Referring physician: Dr Clarene Duke, ED physician PCP: No PCP Per Patient  Outpatient Specialists:   Dr Gwenyth Bouillon (Urology)  Chief Complaint: Shortness of breath  HPI: Heather Miller is a 81 y.o. female with a history of arthritis, blindness secondary to macular degeneration, kidney stones. Patient seen with 2 weeks of worsening shortness of breath, productive cough. Her symptoms have been worsening. Symptoms worse with ambulation and improved with rest. She was initially 80% on room air upon arrival patient received nebulizer treatment en route. Her symptoms improved, however the patient still desaturated with attempting to stand with orthostatic vital signs. She also endorses left-sided chest pain. She denies leg swelling, weight gain.   Emergency Department Course: Patient received subsequent laser treatment in the emergency department. Patient had a chest x-ray suspicious for pneumonia and congestive heart failure. Patient given a dose of Lasix and Levaquin.  Review of Systems:   Pt complains of chills, diminished appetite.  Pt denies any fevers, nausea, vomiting, diarrhea, constipation, abdominal pain, shortness of breath, dyspnea on exertion, orthopnea, wheezing, palpitations, headache, vision changes, lightheadedness, dizziness, melena, rectal bleeding.  Review of systems are otherwise negative  Past Medical History:  Diagnosis Date  . Arthritis   . Blind   . Complication of anesthesia   . Kidney stones   . Macular degeneration   . PONV (postoperative nausea and vomiting)   . Varicose vein of leg    Past Surgical History:  Procedure Laterality Date  . CYSTOSCOPY W/ URETERAL STENT PLACEMENT  05/10/2011   Procedure: CYSTOSCOPY WITH STENT REPLACEMENT;  Surgeon: Ky Barban;  Location: [facility];  Service: Urology;  Laterality: Bilateral;  . CYSTOSCOPY/RETROGRADE/URETEROSCOPY  05/10/2011   Procedure:  CYSTOSCOPY/RETROGRADE/URETEROSCOPY;  Surgeon: Ky Barban;  Location: [facility];  Service: Urology;  Laterality: Bilateral;  no stone on right side  . EYE SURGERY  50's   detached retina  . KNEE ARTHROSCOPY     Right, [facility]   Social History:  reports that she has never smoked. She has never used smokeless tobacco. She reports that she does not drink alcohol or use drugs. Patient lives at At home  No Known Allergies  No family history on file.  Patient unaware family history  Prior to Admission medications   Medication Sig Start Date End Date Taking? Authorizing Provider  aspirin EC 81 MG tablet Take 81 mg by mouth daily.   Yes Historical Provider, MD  ibuprofen (ADVIL,MOTRIN) 200 MG tablet Take 200 mg by mouth every 8 (eight) hours as needed for mild pain.   Yes Historical Provider, MD    Physical Exam: BP 119/82   Pulse 105   Temp 98.7 F (37.1 C) (Oral)   Resp (!) 32   Ht 5\' 8"  (1.727 m)   Wt 104.3 kg (230 lb)   SpO2 94%   BMI 34.97 kg/m   General:  Elderly black female. Awake and alert and oriented x3. No acute cardiopulmonary distress.  HEENT: Normocephalic atraumatic.  Right and left ears normal in appearance.  Pupils equal, round, reactive to light. Extraocular muscles are intact. Sclerae anicteric and noninjected.  Moist mucosal membranes. No mucosal lesions.  Neck: Neck supple without lymphadenopathy. No carotid bruits. No masses palpated.  Cardiovascular: Regular rate with normal S1-S2 sounds. No murmurs, rubs, gallops auscultated. No JVD is sheeted secondary to body habitus  Respiratory:  Diminished breath sounds. Rales in the bases bilaterally and in the right  upper lobe. Egophony in the right upper lobe Abdomen: Soft, nontender, nondistended. Active bowel sounds. No masses or hepatosplenomegaly  Skin: No rashes, lesions, or ulcerations.  Dry, warm to touch. 2+ dorsalis pedis and radial pulses. Musculoskeletal: No calf or leg pain. All major joints not  erythematous nontender.  No upper or lower joint deformation.  Good ROM.  No contractures  Psychiatric: Intact judgment and insight. Pleasant and cooperative. Neurologic: No focal neurological deficits. Strength is 5/5 and symmetric in upper and lower extremities.  Cranial nerves II through XII are grossly intact.           Labs on Admission: I have personally reviewed following labs and imaging studies  CBC:  Recent Labs Lab 11/11/16 1110  WBC 13.7*  NEUTROABS 12.3*  HGB 12.6  HCT 38.6  MCV 83.4  PLT 401*   Basic Metabolic Panel:  Recent Labs Lab 11/11/16 1110  NA 133*  K 3.3*  CL 95*  CO2 27  GLUCOSE 136*  BUN 10  CREATININE 0.81  CALCIUM 8.7*   GFR: Estimated Creatinine Clearance: 72.4 mL/min (by C-G formula based on SCr of 0.81 mg/dL). Liver Function Tests: No results for input(s): AST, ALT, ALKPHOS, BILITOT, PROT, ALBUMIN in the last 168 hours. No results for input(s): LIPASE, AMYLASE in the last 168 hours. No results for input(s): AMMONIA in the last 168 hours. Coagulation Profile: No results for input(s): INR, PROTIME in the last 168 hours. Cardiac Enzymes:  Recent Labs Lab 11/11/16 1110  TROPONINI <0.03   BNP (last 3 results) No results for input(s): PROBNP in the last 8760 hours. HbA1C: No results for input(s): HGBA1C in the last 72 hours. CBG: No results for input(s): GLUCAP in the last 168 hours. Lipid Profile: No results for input(s): CHOL, HDL, LDLCALC, TRIG, CHOLHDL, LDLDIRECT in the last 72 hours. Thyroid Function Tests: No results for input(s): TSH, T4TOTAL, FREET4, T3FREE, THYROIDAB in the last 72 hours. Anemia Panel: No results for input(s): VITAMINB12, FOLATE, FERRITIN, TIBC, IRON, RETICCTPCT in the last 72 hours. Urine analysis:    Component Value Date/Time   COLORURINE YELLOW 04/05/2011 1655   APPEARANCEUR HAZY (A) 04/05/2011 1655   LABSPEC 1.020 04/05/2011 1655   PHURINE 6.0 04/05/2011 1655   GLUCOSEU NEGATIVE 04/05/2011 1655     HGBUR MODERATE (A) 04/05/2011 1655   BILIRUBINUR NEGATIVE 04/05/2011 1655   KETONESUR NEGATIVE 04/05/2011 1655   PROTEINUR 30 (A) 04/05/2011 1655   UROBILINOGEN 1.0 04/05/2011 1655   NITRITE NEGATIVE 04/05/2011 1655   LEUKOCYTESUR LARGE (A) 04/05/2011 1655   Sepsis Labs: @LABRCNTIP (procalcitonin:4,lacticidven:4) )No results found for this or any previous visit (from the past 240 hour(s)).   Radiological Exams on Admission: Dg Chest Portable 1 View  Result Date: 11/11/2016 CLINICAL DATA:  Shortness of breath with cough; left-sided chest pain EXAM: PORTABLE CHEST 1 VIEW COMPARISON:  December 17, 2011 FINDINGS: There is diffuse interstitial edema with patchy consolidation in the right upper lobe. Heart is upper normal in size with pulmonary venous hypertension. No evident adenopathy. No bone lesions. Slight elevation of the left hemidiaphragm is stable. No pneumothorax. IMPRESSION: Findings indicative of congestive heart failure. Question alveolar edema versus superimposed pneumonia right upper lobe. Stable cardiac silhouette. There is pulmonary venous hypertension. Electronically Signed   By: Bretta BangWilliam  Woodruff III M.D.   On: 11/11/2016 11:27    EKG: Independently reviewed. sinus tachycardia with PACs. Possible old inferior infarct. LVH. Left Axis deviation and no acute ST changes.  Assessment/Plan: Principal Problem:   Acute respiratory failure  with hypoxia (HCC) Active Problems:   CAP (community acquired pneumonia)   Congestive heart failure (CHF) (HCC)   Hypokalemia    This patient was discussed with the ED physician, including pertinent vitals, physical exam findings, labs, and imaging.  We also discussed care given by the ED provider.  #1 acute respiratory failure with hypoxia  Admit to telemetry  Supplemental O2 via nasal cannula - no evidence of increased work of breathing #2 community acquired pneumonia Antibiotics: Levaquin Robitussin Blood cultures drawn in the  emergency department Sputum cultures CBC tomorrow Strep antigen by urine Procalcitonin #3 congestive heart failure  Telemetry monitoring  Strict I/O  Daily Weights  Diuresis: Lasix 40mg  IV BID  Potassium: 40 mEq twice a day by mouth  Echo cardiac exam tomorrow  Repeat BMP tomorrow #4 hypokalemia  Potassium replacement  DVT prophylaxis: Lovenox Consultants: None Code Status: Full code Family Communication: None  Disposition Plan: Pending   Levie Heritage, DO [facility] Pager [phone]  If 7PM-7AM, please contact night-coverage [email or web address] Password [email or web address]

## 2016-11-11 NOTE — ED Provider Notes (Signed)
[facility] Provider Note   CSN: [id number] Arrival date & time: 11/11/16  1017     History   Chief Complaint Chief Complaint  Patient presents with  . Shortness of Breath    HPI Heather Miller is a 81 y.o. female.  HPI  Pt was seen at 1035. Per pt and her family, c/o gradual onset and worsening of persistent generalized weakness for the past 2 weeks, worse over the past few days. Pt states she "can't walk now" because she is "too weak." Has been associated with SOB, cough, and left sided chest "pain." EMS noted O2 Sat 82% R/A on their arrival to scene. Denies palpitations, no fevers, no abd pain, no N/V/D, no back pain, no focal motor weakness, no tingling/numbness in extremities.   Past Medical History:  Diagnosis Date  . Arthritis   . Blind   . Complication of anesthesia   . Kidney stones   . Macular degeneration   . PONV (postoperative nausea and vomiting)   . Varicose vein of leg     Patient Active Problem List   Diagnosis Date Noted  . MACULAR DEGENERATION 07/15/2009  . CHEST PAIN 07/15/2009    Past Surgical History:  Procedure Laterality Date  . CYSTOSCOPY W/ URETERAL STENT PLACEMENT  05/10/2011   Procedure: CYSTOSCOPY WITH STENT REPLACEMENT;  Surgeon: Ky Barban;  Location: [facility];  Service: Urology;  Laterality: Bilateral;  . CYSTOSCOPY/RETROGRADE/URETEROSCOPY  05/10/2011   Procedure: CYSTOSCOPY/RETROGRADE/URETEROSCOPY;  Surgeon: Ky Barban;  Location: [facility];  Service: Urology;  Laterality: Bilateral;  no stone on right side  . EYE SURGERY  50's   detached retina  . KNEE ARTHROSCOPY     Right, [facility]    OB History    No data available       Home Medications    Prior to Admission medications   Medication Sig Start Date End Date Taking? Authorizing Provider  aspirin EC 81 MG tablet Take 81 mg by mouth daily.   Yes Historical Provider, MD  ibuprofen (ADVIL,MOTRIN) 200 MG tablet Take 200 mg by mouth every 8 (eight) hours as  needed for mild pain.   Yes Historical Provider, MD    Family History No family history on file.  Social History Social History  Substance Use Topics  . Smoking status: Never Smoker  . Smokeless tobacco: Never Used  . Alcohol use No     Allergies   Patient has no known allergies.   Review of Systems Review of Systems ROS: Statement: All systems negative except as marked or noted in the HPI; Constitutional: Negative for fever and chills. +generalized weakness. ; ; Eyes: Negative for eye pain, redness and discharge. ; ; ENMT: Negative for ear pain, hoarseness, nasal congestion, sinus pressure and sore throat. ; ; Cardiovascular: +CP. Negative for palpitations, diaphoresis, and peripheral edema. ; ; Respiratory: +cough, SOB. Negative for wheezing and stridor. ; ; Gastrointestinal: Negative for nausea, vomiting, diarrhea, abdominal pain, blood in stool, hematemesis, jaundice and rectal bleeding. . ; ; Genitourinary: Negative for dysuria, flank pain and hematuria. ; ; Musculoskeletal: Negative for back pain and neck pain. Negative for swelling and trauma.; ; Skin: Negative for pruritus, rash, abrasions, blisters, bruising and skin lesion.; ; Neuro: Negative for headache, lightheadedness and neck stiffness. Negative for altered level of consciousness, altered mental status, extremity weakness, paresthesias, involuntary movement, seizure and syncope.       Physical Exam Updated Vital Signs BP 105/74   Pulse 109   Temp  98.6 F (37 C)   Resp (!) 31   Ht 5\' 8"  (1.727 m)   Wt 230 lb (104.3 kg)   SpO2 95%   BMI 34.97 kg/m    13:52:55 Orthostatic Vital Signs TV  Orthostatic Lying   BP- Lying: 115/83  Pulse- Lying: 107      Orthostatic Sitting  BP- Sitting: 123/72  Pulse- Sitting: 122      Orthostatic Standing at 0 minutes  BP- Standing at 0 minutes:  (pt unable to remain standing for bp check. )  Pulse- Standing at 0 minutes: 135   Patient Vitals for the past 24 hrs:  BP  Temp Temp src Pulse Resp SpO2 Height Weight  11/11/16 1543 131/75 98.7 F (37.1 C) Oral 100 19 95 % - -  11/11/16 1510 - - - - - 96 % - -  11/11/16 1500 104/70 - - 99 (!) 31 97 % - -  11/11/16 1430 112/68 - - 102 21 95 % - -  11/11/16 1400 130/69 - - (!) 129 (!) 40 90 % - -  11/11/16 1330 108/69 - - - 26 - - -  11/11/16 1300 111/78 - - - 21 - - -  11/11/16 1230 126/82 - - - (!) 29 - - -  11/11/16 1200 108/76 - - 112 (!) 29 98 % - -  11/11/16 1145 - - - 109 (!) 31 95 % - -  11/11/16 1130 105/74 - - 111 23 95 % - -  11/11/16 1100 113/74 - - 108 (!) 32 95 % - -  11/11/16 1045 - - - 89 (!) 28 95 % - -  11/11/16 1025 - - - - - 91 % - -  11/11/16 1024 132/79 98.6 F (37 C) - (!) 130 (!) 35 (!) 82 % - -  11/11/16 1023 - - - - - - 5\' 8"  (1.727 m) 230 lb (104.3 kg)     Physical Exam 1040: Physical examination:  Nursing notes reviewed; Vital signs and O2 SAT reviewed;  Constitutional: Well developed, Well nourished, Well hydrated, In no acute distress; Head:  Normocephalic, atraumatic; Eyes: EOMI, PERRL, No scleral icterus; ENMT: Mouth and pharynx normal, Mucous membranes moist; Neck: Supple, Full range of motion, No lymphadenopathy; Cardiovascular: Tachycardic rate and rhythm, No gallop; Respiratory: Breath sounds coarse & equal bilaterally, No wheezes.  Speaking full sentences with ease, Normal respiratory effort/excursion; Chest: Nontender, Movement normal; Abdomen: Soft, Nontender, Nondistended, Normal bowel sounds; Genitourinary: No CVA tenderness; Extremities: Pulses normal, No tenderness, No edema, No calf edema or asymmetry.; Neuro: AA&Ox3, +blind per hx. Otherwise major CN grossly intact.  Speech clear. No gross focal motor deficits in extremities.; Skin: Color normal, Warm, Dry.   ED Treatments / Results  Labs (all labs ordered are listed, but only abnormal results are displayed)   EKG  EKG Interpretation None       Radiology   Procedures Procedures (including critical care  time)  Medications Ordered in ED Medications - No data to display   Initial Impression / Assessment and Plan / ED Course  I have reviewed the triage vital signs and the nursing notes.  Pertinent labs & imaging results that were available during my care of the patient were reviewed by me and considered in my medical decision making (see chart for details).  MDM Reviewed: nursing note, previous chart and vitals Reviewed previous: labs and ECG Interpretation: labs, ECG and x-ray   Results for orders placed or performed during the  hospital encounter of 11/11/16  CBC with Differential  Result Value Ref Range   WBC 13.7 (H) 4.0 - 10.5 K/uL   RBC 4.63 3.87 - 5.11 MIL/uL   Hemoglobin 12.6 12.0 - 15.0 g/dL   HCT 16.138.6 09.636.0 - 04.546.0 %   MCV 83.4 78.0 - 100.0 fL   MCH 27.2 26.0 - 34.0 pg   MCHC 32.6 30.0 - 36.0 g/dL   RDW 40.914.0 81.111.5 - 91.415.5 %   Platelets 401 (H) 150 - 400 K/uL   Neutrophils Relative % 90 %   Neutro Abs 12.3 (H) 1.7 - 7.7 K/uL   Lymphocytes Relative 4 %   Lymphs Abs 0.6 (L) 0.7 - 4.0 K/uL   Monocytes Relative 6 %   Monocytes Absolute 0.8 0.1 - 1.0 K/uL   Eosinophils Relative 0 %   Eosinophils Absolute 0.0 0.0 - 0.7 K/uL   Basophils Relative 0 %   Basophils Absolute 0.0 0.0 - 0.1 K/uL  Basic metabolic panel  Result Value Ref Range   Sodium 133 (L) 135 - 145 mmol/L   Potassium 3.3 (L) 3.5 - 5.1 mmol/L   Chloride 95 (L) 101 - 111 mmol/L   CO2 27 22 - 32 mmol/L   Glucose, Bld 136 (H) 65 - 99 mg/dL   BUN 10 6 - 20 mg/dL   Creatinine, Ser 7.820.81 0.44 - 1.00 mg/dL   Calcium 8.7 (L) 8.9 - 10.3 mg/dL   GFR calc non Af Amer >60 >60 mL/min   GFR calc Af Amer >60 >60 mL/min   Anion gap 11 5 - 15  Troponin I  Result Value Ref Range   Troponin I <0.03 <0.03 ng/mL  Lactic acid, plasma  Result Value Ref Range   Lactic Acid, Venous 1.6 0.5 - 1.9 mmol/L  Lactic acid, plasma  Result Value Ref Range   Lactic Acid, Venous 1.4 0.5 - 1.9 mmol/L  Brain natriuretic peptide  Result  Value Ref Range   B Natriuretic Peptide 74.0 0.0 - 100.0 pg/mL   Dg Chest Portable 1 View Result Date: 11/11/2016 CLINICAL DATA:  Shortness of breath with cough; left-sided chest pain EXAM: PORTABLE CHEST 1 VIEW COMPARISON:  December 17, 2011 FINDINGS: There is diffuse interstitial edema with patchy consolidation in the right upper lobe. Heart is upper normal in size with pulmonary venous hypertension. No evident adenopathy. No bone lesions. Slight elevation of the left hemidiaphragm is stable. No pneumothorax. IMPRESSION: Findings indicative of congestive heart failure. Question alveolar edema versus superimposed pneumonia right upper lobe. Stable cardiac silhouette. There is pulmonary venous hypertension. Electronically Signed   By: Bretta BangWilliam  Woodruff III M.D.   On: 11/11/2016 11:27    1530:  Pt unable to stand for orthostatic VS. Pt became more SOB when standing, with O2 Sats decreasing to 86% on O2 3L N/C. Pt sat back on stretcher with O2 Sats increasing to 92% on O2 3L N/C.  Tx for CAP with abx and CHF with IV lasix. Short neb also given, with improvement in O2 Sats. Dx and testing d/w pt and family.  Questions answered.  Verb understanding, agreeable to admit.  T/C to [facility] Dr. Adrian BlackwaterStinson, case discussed, including:  HPI, pertinent PM/SHx, VS/PE, dx testing, ED course and treatment:  Agreeable to admit, requests to write temporary orders, obtain tele bed to team [facility].   Final Clinical Impressions(s) / ED Diagnoses   Final diagnoses:  None    New Prescriptions New Prescriptions   No medications on file  Samuel Jester, DO 11/13/16 1814

## 2016-11-11 NOTE — ED Notes (Signed)
Pt unable to remain in standing position for bp check during orthostatic vital signs. Pt became increasingly sob upon standing and O2 saturation decreased to 86% on 3l [place]. Pt assisted back to stretcher. O2 saturation increased to 92%.

## 2016-11-11 NOTE — ED Triage Notes (Signed)
Pt c/o flu like sx x 2 weeks. SOB today. EMS reports saturation 82% on ra upon arrival. Pt received 2 2.5mg  albuterol in route. Pt also reports productive cough and left sided cp.

## 2016-11-11 NOTE — ED Notes (Signed)
Pt placed on 4L for 02 sats 82%.

## 2016-11-12 ENCOUNTER — Observation Stay (HOSPITAL_COMMUNITY): Payer: Medicare Other

## 2016-11-12 DIAGNOSIS — J441 Chronic obstructive pulmonary disease with (acute) exacerbation: Secondary | ICD-10-CM | POA: Diagnosis not present

## 2016-11-12 DIAGNOSIS — I252 Old myocardial infarction: Secondary | ICD-10-CM | POA: Diagnosis not present

## 2016-11-12 DIAGNOSIS — I509 Heart failure, unspecified: Secondary | ICD-10-CM

## 2016-11-12 DIAGNOSIS — I5031 Acute diastolic (congestive) heart failure: Secondary | ICD-10-CM | POA: Diagnosis present

## 2016-11-12 DIAGNOSIS — N179 Acute kidney failure, unspecified: Secondary | ICD-10-CM | POA: Diagnosis present

## 2016-11-12 DIAGNOSIS — J449 Chronic obstructive pulmonary disease, unspecified: Secondary | ICD-10-CM | POA: Diagnosis present

## 2016-11-12 DIAGNOSIS — E876 Hypokalemia: Secondary | ICD-10-CM | POA: Diagnosis present

## 2016-11-12 DIAGNOSIS — I110 Hypertensive heart disease with heart failure: Secondary | ICD-10-CM | POA: Diagnosis present

## 2016-11-12 DIAGNOSIS — Z7982 Long term (current) use of aspirin: Secondary | ICD-10-CM | POA: Diagnosis not present

## 2016-11-12 DIAGNOSIS — Z79899 Other long term (current) drug therapy: Secondary | ICD-10-CM | POA: Diagnosis not present

## 2016-11-12 DIAGNOSIS — H547 Unspecified visual loss: Secondary | ICD-10-CM | POA: Diagnosis present

## 2016-11-12 DIAGNOSIS — Z23 Encounter for immunization: Secondary | ICD-10-CM | POA: Diagnosis present

## 2016-11-12 DIAGNOSIS — J189 Pneumonia, unspecified organism: Secondary | ICD-10-CM | POA: Diagnosis present

## 2016-11-12 DIAGNOSIS — J9601 Acute respiratory failure with hypoxia: Secondary | ICD-10-CM | POA: Diagnosis present

## 2016-11-12 DIAGNOSIS — R0602 Shortness of breath: Secondary | ICD-10-CM | POA: Diagnosis present

## 2016-11-12 DIAGNOSIS — J440 Chronic obstructive pulmonary disease with acute lower respiratory infection: Secondary | ICD-10-CM | POA: Diagnosis present

## 2016-11-12 DIAGNOSIS — H3530 Unspecified macular degeneration: Secondary | ICD-10-CM | POA: Diagnosis present

## 2016-11-12 LAB — BASIC METABOLIC PANEL
ANION GAP: 13 (ref 5–15)
BUN: 13 mg/dL (ref 6–20)
CALCIUM: 8.9 mg/dL (ref 8.9–10.3)
CHLORIDE: 96 mmol/L — AB (ref 101–111)
CO2: 28 mmol/L (ref 22–32)
Creatinine, Ser: 1.01 mg/dL — ABNORMAL HIGH (ref 0.44–1.00)
GFR calc non Af Amer: 52 mL/min — ABNORMAL LOW (ref >60)
Glucose, Bld: 98 mg/dL (ref 65–99)
POTASSIUM: 3.9 mmol/L (ref 3.5–5.1)
Sodium: 137 mmol/L (ref 135–145)

## 2016-11-12 LAB — URINALYSIS, ROUTINE W REFLEX MICROSCOPIC
Bilirubin Urine: NEGATIVE
Glucose, UA: NEGATIVE mg/dL
HGB URINE DIPSTICK: NEGATIVE
Ketones, ur: 5 mg/dL — AB
Leukocytes, UA: NEGATIVE
NITRITE: NEGATIVE
PROTEIN: NEGATIVE mg/dL
Specific Gravity, Urine: 1.017 (ref 1.005–1.030)
pH: 5 (ref 5.0–8.0)

## 2016-11-12 LAB — CBC
HCT: 34.4 % — ABNORMAL LOW (ref 36.0–46.0)
Hemoglobin: 11.2 g/dL — ABNORMAL LOW (ref 12.0–15.0)
MCH: 27.3 pg (ref 26.0–34.0)
MCHC: 32.6 g/dL (ref 30.0–36.0)
MCV: 83.7 fL (ref 78.0–100.0)
PLATELETS: 417 10*3/uL — AB (ref 150–400)
RBC: 4.11 MIL/uL (ref 3.87–5.11)
RDW: 14.2 % (ref 11.5–15.5)
WBC: 9.5 10*3/uL (ref 4.0–10.5)

## 2016-11-12 LAB — ECHOCARDIOGRAM COMPLETE
HEIGHTINCHES: 68 in
WEIGHTICAEL: 3419.2 [oz_av]

## 2016-11-12 LAB — STREP PNEUMONIAE URINARY ANTIGEN: Strep Pneumo Urinary Antigen: NEGATIVE

## 2016-11-12 NOTE — Progress Notes (Signed)
PROGRESS NOTE    Heather Miller  ZOX:[id number] DOB: 02/25/1936 DOA: 11/11/2016 PCP: No PCP Per Patient    Brief Narrative: 81 yo with macular degeneration, blindness, hx of CHF, lives at home independently, admitted for CAP and CHF.  She felt better with IV Lasix and IV Levaquin, along with oxygen.  She has no complaints.    Assessment & Plan:   Principal Problem:   Acute respiratory failure with hypoxia (HCC) Active Problems:   CAP (community acquired pneumonia)   Congestive heart failure (CHF) (HCC)   Hypokalemia  #1 acute respiratory failure with hypoxia  Admit to telemetry  Supplemental O2 via nasal cannula - no evidence of increased work of breathing #2 community acquired pneumonia  Antibiotics: Levaquin  Robitussin  Blood cultures drawn in the emergency department  Sputum cultures  CBC tomorrow  Strep antigen by urine  Procalcitonin #3 congestive heart failure  Telemetry monitoring  Strict I/O  Daily Weights  Diuresis: Lasix 40mg  IV BID  Potassium: 40 mEq twice a day by mouth  Echo cardiac exam tomorrow  Repeat BMP tomorrow #4 hypokalemia  Potassium replacement  DVT prophylaxis: Lovenox Consultants: None Code Status: Full code Family Communication: None  Disposition Plan: Pending     Antimicrobials: Anti-infectives    Start     Dose/Rate Route Frequency Ordered Stop   11/12/16 1500  levofloxacin (LEVAQUIN) tablet 750 mg     750 mg Oral Every 24 hours 11/11/16 1910 11/17/16 1459   11/11/16 1500  levofloxacin (LEVAQUIN) IVPB 750 mg     750 mg 100 mL/hr over 90 Minutes Intravenous  Once 11/11/16 1454 11/11/16 1710       Subjective:  Feeling better.   No complaints.   Objective: Vitals:   11/11/16 1700 11/11/16 1730 11/11/16 1847 11/12/16 0627  BP: 119/82 123/79 104/63 113/63  Pulse:   (!) 102 (!) 117  Resp: (!) 32 (!) 27 (!) 22 18  Temp:   97.9 F (36.6 C) 98.5 F (36.9 C)  TempSrc:   Oral Oral  SpO2:   93% 92%  Weight:    97.5 kg (214 lb 14.4 oz) 96.9 kg (213 lb 11.2 oz)  Height:        Intake/Output Summary (Last 24 hours) at 11/12/16 1239 Last data filed at 11/11/16 2300  Gross per 24 hour  Intake              240 ml  Output                0 ml  Net              240 ml   Filed Weights   11/11/16 1023 11/11/16 1847 11/12/16 0627  Weight: 104.3 kg (230 lb) 97.5 kg (214 lb 14.4 oz) 96.9 kg (213 lb 11.2 oz)    Examination:  General exam: Appears calm and comfortable  Respiratory system: Clear to auscultation. Respiratory effort normal. Cardiovascular system: S1 & S2 heard, RRR. No JVD, murmurs, rubs, gallops or clicks. No pedal edema. Gastrointestinal system: Abdomen is nondistended, soft and nontender. No organomegaly or masses felt. Normal bowel sounds heard. Central nervous system: Alert and oriented. No focal neurological deficits. Extremities: Symmetric 5 x 5 power. Skin: No rashes, lesions or ulcers Psychiatry: Judgement and insight appear normal. Mood & affect appropriate.   Data Reviewed: I have personally reviewed following labs and imaging studies  CBC:  Recent Labs Lab 11/11/16 1110 11/12/16 0627  WBC 13.7* 9.5  NEUTROABS 12.3*  --  HGB 12.6 11.2*  HCT 38.6 34.4*  MCV 83.4 83.7  PLT 401* 417*   Basic Metabolic Panel:  Recent Labs Lab 11/11/16 1110 11/12/16 0627  NA 133* 137  K 3.3* 3.9  CL 95* 96*  CO2 27 28  GLUCOSE 136* 98  BUN 10 13  CREATININE 0.81 1.01*  CALCIUM 8.7* 8.9   GFR:  Recent Labs Lab 11/11/16 1110  TROPONINI <0.03   Sepsis Labs:  Recent Labs Lab 11/11/16 1110 11/11/16 1405 11/11/16 1932  PROCALCITON  --   --  1.57  LATICACIDVEN 1.6 1.4  --     Recent Results (from the past 240 hour(s))  Culture, blood (routine x 2) Call MD if unable to obtain prior to antibiotics being given     Status: None (Preliminary result)   Collection Time: 11/11/16  7:31 PM  Result Value Ref Range Status   Specimen Description BLOOD RIGHT FOREARM  Final    Special Requests BOTTLES DRAWN AEROBIC AND ANAEROBIC 7CC EACH  Final   Culture NO GROWTH < 24 HOURS  Final   Report Status PENDING  Incomplete  Culture, blood (routine x 2) Call MD if unable to obtain prior to antibiotics being given     Status: None (Preliminary result)   Collection Time: 11/11/16  7:32 PM  Result Value Ref Range Status   Specimen Description RIGHT ANTECUBITAL  Final   Special Requests BOTTLES DRAWN AEROBIC AND ANAEROBIC 6CC EACH  Final   Culture NO GROWTH < 24 HOURS  Final   Report Status PENDING  Incomplete     Radiology Studies: Dg Chest Portable 1 View  Result Date: 11/11/2016 CLINICAL DATA:  Shortness of breath with cough; left-sided chest pain EXAM: PORTABLE CHEST 1 VIEW COMPARISON:  December 17, 2011 FINDINGS: There is diffuse interstitial edema with patchy consolidation in the right upper lobe. Heart is upper normal in size with pulmonary venous hypertension. No evident adenopathy. No bone lesions. Slight elevation of the left hemidiaphragm is stable. No pneumothorax. IMPRESSION: Findings indicative of congestive heart failure. Question alveolar edema versus superimposed pneumonia right upper lobe. Stable cardiac silhouette. There is pulmonary venous hypertension. Electronically Signed   By: Bretta Bang III M.D.   On: 11/11/2016 11:27    Scheduled Meds: . aspirin EC  81 mg Oral Daily  . enoxaparin (LOVENOX) injection  40 mg Subcutaneous Q24H  . furosemide  40 mg Intravenous BID  . Influenza vac split quadrivalent PF  0.5 mL Intramuscular Tomorrow-1000  . levofloxacin  750 mg Oral Q24H  . pneumococcal 23 valent vaccine  0.5 mL Intramuscular Tomorrow-1000  . potassium chloride  40 mEq Oral BID  . sodium chloride flush  3 mL Intravenous Q12H   Continuous Infusions:   LOS: 0 days   Heather Piechocki, MD FACP Hospitalist.   If 7PM-7AM, please contact night-coverage [email or web address] Password [facility] 11/12/2016, 12:39 PM

## 2016-11-12 NOTE — Progress Notes (Signed)
*  PRELIMINARY RESULTS* Echocardiogram 2D Echocardiogram has been performed.  Jeryl Columbialliott, Shaka Zech 11/12/2016, 9:37 AM

## 2016-11-13 DIAGNOSIS — J441 Chronic obstructive pulmonary disease with (acute) exacerbation: Secondary | ICD-10-CM

## 2016-11-13 LAB — BASIC METABOLIC PANEL
Anion gap: 11 (ref 5–15)
BUN: 17 mg/dL (ref 6–20)
CHLORIDE: 95 mmol/L — AB (ref 101–111)
CO2: 27 mmol/L (ref 22–32)
Calcium: 8.9 mg/dL (ref 8.9–10.3)
Creatinine, Ser: 1.07 mg/dL — ABNORMAL HIGH (ref 0.44–1.00)
GFR calc Af Amer: 56 mL/min — ABNORMAL LOW (ref >60)
GFR calc non Af Amer: 48 mL/min — ABNORMAL LOW (ref >60)
Glucose, Bld: 106 mg/dL — ABNORMAL HIGH (ref 65–99)
POTASSIUM: 4.4 mmol/L (ref 3.5–5.1)
SODIUM: 133 mmol/L — AB (ref 135–145)

## 2016-11-13 LAB — HIV ANTIBODY (ROUTINE TESTING W REFLEX): HIV Screen 4th Generation wRfx: NONREACTIVE

## 2016-11-13 LAB — PROCALCITONIN: PROCALCITONIN: 1.1 ng/mL

## 2016-11-13 NOTE — Progress Notes (Signed)
PROGRESS NOTE    Heather Miller  ZOX:[id number] DOB: [birth year] DOA: 11/11/2016 PCP: No PCP Per Patient    Brief Narrative: 81 yo with macular degeneration, blindness, hx of CHF, lives at home independently, admitted for CAP and CHF.  She felt better with IV Lasix and IV Levaquin, along with oxygen.  She has no complaints.    Assessment & Plan:   Principal Problem:   Acute respiratory failure with hypoxia (HCC) Active Problems:   CAP (community acquired pneumonia)   Congestive heart failure (CHF) (HCC)   Hypokalemia   COPD exacerbation (HCC)   #1 acute respiratory failure with hypoxia  Admit to telemetry  Supplemental O2 via nasal cannula - no evidence of increased work of breathing   #2 community acquired pneumonia  Continue with IV Levoquin.    #3 congestive heart failure  Telemetry monitoring  Strict I/O  Daily Weights  Diuresis: Lasix 40mg  IV BID  Potassium: twice a day by mouth  ECHO showed EF 55 60% some inferobasal hypokinesis with grade 2 diastolic dysfx.   #4 hypokalemia  Potassium replacement  DVT prophylaxis:Lovenox Consultants:None Code Status:Full code Family Communication:None Disposition Plan:Pending    Procedures: None.    Anti-infectives    Start     Dose/Rate Route Frequency Ordered Stop   11/12/16 1500  levofloxacin (LEVAQUIN) tablet 750 mg     750 mg Oral Every 24 hours 11/11/16 1910 11/17/16 1459   11/11/16 1500  levofloxacin (LEVAQUIN) IVPB 750 mg     750 mg 100 mL/hr over 90 Minutes Intravenous  Once 11/11/16 1454 11/11/16 1710       Subjective:  Feeling SOB.     Objective: Vitals:   11/12/16 0627 11/12/16 1435 11/12/16 2302 11/13/16 0551  BP: 113/63 103/63 101/65 109/70  Pulse: (!) 117 (!) 102 (!) 110 (!) 108  Resp: 18 18 16 17   Temp: 98.5 F (36.9 C) 98.6 F (37 C) 98.8 F (37.1 C) 98 F (36.7 C)  TempSrc: Oral Oral Oral Oral  SpO2: 92% 95% 97% 92%  Weight: 96.9 kg (213 lb 11.2 oz)       Height:        Intake/Output Summary (Last 24 hours) at 11/13/16 1124 Last data filed at 11/12/16 2215  Gross per 24 hour  Intake              243 ml  Output                0 ml  Net              243 ml   Filed Weights   11/11/16 1023 11/11/16 1847 11/12/16 0627  Weight: 104.3 kg (230 lb) 97.5 kg (214 lb 14.4 oz) 96.9 kg (213 lb 11.2 oz)    Examination:  General exam: Appears calm and comfortable  Respiratory system: Clear to auscultation. Respiratory effort normal. Cardiovascular system: S1 & S2 heard, RRR. No JVD, murmurs, rubs, gallops or clicks. No pedal edema. Gastrointestinal system: Abdomen is nondistended, soft and nontender. No organomegaly or masses felt. Normal bowel sounds heard. Central nervous system: Alert and oriented. No focal neurological deficits. Extremities: Symmetric 5 x 5 power. Skin: No rashes, lesions or ulcers Psychiatry: Judgement and insight appear normal. Mood & affect appropriate.   Data Reviewed: I have personally reviewed following labs and imaging studies  CBC:  Recent Labs Lab 11/11/16 1110 11/12/16 0627  WBC 13.7* 9.5  NEUTROABS 12.3*  --   HGB 12.6 11.2*  HCT 38.6 34.4*  MCV 83.4 83.7  PLT 401* 417*   Basic Metabolic Panel:  Recent Labs Lab 11/11/16 1110 11/12/16 0627 11/13/16 0644  NA 133* 137 133*  K 3.3* 3.9 4.4  CL 95* 96* 95*  CO2 27 28 27   GLUCOSE 136* 98 106*  BUN 10 13 17   CREATININE 0.81 1.01* 1.07*  CALCIUM 8.7* 8.9 8.9    Cardiac Enzymes:  Recent Labs Lab 11/11/16 1110  TROPONINI <0.03   Sepsis Labs:  Recent Labs Lab 11/11/16 1110 11/11/16 1405 11/11/16 1932 11/13/16 0644  PROCALCITON  --   --  1.57 1.10  LATICACIDVEN 1.6 1.4  --   --     Recent Results (from the past 240 hour(s))  Culture, blood (routine x 2) Call MD if unable to obtain prior to antibiotics being given     Status: None (Preliminary result)   Collection Time: 11/11/16  7:31 PM  Result Value Ref Range Status   Specimen  Description BLOOD RIGHT FOREARM  Final   Special Requests BOTTLES DRAWN AEROBIC AND ANAEROBIC 7CC EACH  Final   Culture NO GROWTH < 24 HOURS  Final   Report Status PENDING  Incomplete  Culture, blood (routine x 2) Call MD if unable to obtain prior to antibiotics being given     Status: None (Preliminary result)   Collection Time: 11/11/16  7:32 PM  Result Value Ref Range Status   Specimen Description RIGHT ANTECUBITAL  Final   Special Requests BOTTLES DRAWN AEROBIC AND ANAEROBIC 6CC EACH  Final   Culture NO GROWTH < 24 HOURS  Final   Report Status PENDING  Incomplete     Radiology Studies: No results found.  Scheduled Meds: . aspirin EC  81 mg Oral Daily  . enoxaparin (LOVENOX) injection  40 mg Subcutaneous Q24H  . furosemide  40 mg Intravenous BID  . Influenza vac split quadrivalent PF  0.5 mL Intramuscular Tomorrow-1000  . levofloxacin  750 mg Oral Q24H  . pneumococcal 23 valent vaccine  0.5 mL Intramuscular Tomorrow-1000  . potassium chloride  40 mEq Oral BID  . sodium chloride flush  3 mL Intravenous Q12H   Continuous Infusions:   LOS: 1 day   Heather Cavenaugh, MD FACP Hospitalist.   If 7PM-7AM, please contact night-coverage [email or web address] Password [email or web address] 11/13/2016, 11:24 AM

## 2016-11-14 LAB — BASIC METABOLIC PANEL
Anion gap: 10 (ref 5–15)
BUN: 20 mg/dL (ref 6–20)
CALCIUM: 9.1 mg/dL (ref 8.9–10.3)
CHLORIDE: 96 mmol/L — AB (ref 101–111)
CO2: 29 mmol/L (ref 22–32)
CREATININE: 0.98 mg/dL (ref 0.44–1.00)
GFR calc non Af Amer: 54 mL/min — ABNORMAL LOW (ref >60)
Glucose, Bld: 108 mg/dL — ABNORMAL HIGH (ref 65–99)
Potassium: 4.8 mmol/L (ref 3.5–5.1)
SODIUM: 135 mmol/L (ref 135–145)

## 2016-11-14 MED ORDER — FUROSEMIDE 40 MG PO TABS
40.0000 mg | ORAL_TABLET | Freq: Every day | ORAL | Status: DC
  Administered 2016-11-14 – 2016-11-17 (×4): 40 mg via ORAL
  Filled 2016-11-14 (×5): qty 1

## 2016-11-14 NOTE — Care Management Note (Signed)
Case Management Note  Patient Details  Name: Lonny PrudeSara P Colebank MRN: [id number] Date of Birth: 06/08/1936  Subjective/Objective:                  Patient adm with acute respiratory failure with hypoxia. She is legally blind and lives alone. Daughter lives next door and checks on her daily. She still cooks sometimes at home. She has a cane and walker PTA. She had Dr. Megan MansMcginnis as PCP, CM will make appointment with Dr. Selena BattenKim to establish care. She does not have oxygen at home, currenlty on 2-3L. She is agreeable to [facility]. PT consult pending.  Offered choice of HH agencies.   [facility]Plan[facility] and will obtain orders from chart for RN, CM will add PT if necessary. CM will follow for oxygen needs.    Expected Discharge Date:          11/16/2016        Expected Discharge Plan:  Home w Home Health Services  In-House Referral:  NA  Discharge planning Services  CM Consult  Post Acute Care Choice:    Choice offered to:     DME Arranged:    DME Agency:     HH Arranged:    HH Agency:     Status of Service:  In process, will continue to follow  If discussed at Long Length of Stay Meetings, dates discussed:    Additional Comments:  Darla Mcdonald, Chrystine OilerSharley Diane, RN 11/14/2016, 12:52 PM

## 2016-11-14 NOTE — Progress Notes (Signed)
PROGRESS NOTE    Heather Miller  ZOX:[id number] DOB: [birth year] DOA: 11/11/2016 PCP: No PCP Per Patient     Brief Narrative: 81 yo with macular degeneration, blindness, hx of CHF, lives at home independently, admitted for CAP and CHF. She felt better with IV Lasix and IV Levaquin, along with oxygen. She has no complaints. Appreciate CM's help.  She will get HHA, RN, and PT if required.  Will see if she needs home oxygen as well.    Assessment & Plan:   Principal Problem:   Acute respiratory failure with hypoxia (HCC) Active Problems:   CAP (community acquired pneumonia)   Congestive heart failure (CHF) (HCC)   Hypokalemia   COPD exacerbation (HCC)   #1 acute respiratory failure with hypoxia  Admit to telemetry  Supplemental O2 via nasal cannula - no evidence of increased work of breathing   #2 community acquired pneumonia  Continue with IV Levoquin.    #3 congestive heart failure  Telemetry monitoring  Strict I/O  Daily Weights  Diuresis: Lasix 40mg  IV BID  Potassium: twice a day by mouth  ECHO showed EF 55 60% some inferobasal hypokinesis with grade 2 diastolic dysfx.   #4 hypokalemia  Potassium replacement  DVT prophylaxis:Lovenox Consultants:None Code Status:Full code Family Communication:None Disposition Plan:Pending   Consultants:   None.   Procedures:   None.   Antimicrobials: Anti-infectives    Start     Dose/Rate Route Frequency Ordered Stop   11/12/16 1500  levofloxacin (LEVAQUIN) tablet 750 mg     750 mg Oral Every 24 hours 11/11/16 1910 11/17/16 1459   11/11/16 1500  levofloxacin (LEVAQUIN) IVPB 750 mg     750 mg 100 mL/hr over 90 Minutes Intravenous  Once 11/11/16 1454 11/11/16 1710       Subjective:  No complaints. Still has DOE>   Objective: Vitals:   11/13/16 1500 11/13/16 2257 11/14/16 0623 11/14/16 1308  BP: 102/65 (!) 103/59 110/68 120/74  Pulse: 99 (!) 110 (!) 109 100  Resp: 16 15 16 20   Temp:  97.5 F (36.4 C) 98.6 F (37 C) 98.6 F (37 C) 98 F (36.7 C)  TempSrc: Oral Oral Oral Oral  SpO2: 97% 95%  97%  Weight:   94.2 kg (207 lb 11.2 oz)   Height:        Intake/Output Summary (Last 24 hours) at 11/14/16 1453 Last data filed at 11/14/16 1310  Gross per 24 hour  Intake              143 ml  Output              350 ml  Net             -207 ml   Filed Weights   11/11/16 1847 11/12/16 0627 11/14/16 0623  Weight: 97.5 kg (214 lb 14.4 oz) 96.9 kg (213 lb 11.2 oz) 94.2 kg (207 lb 11.2 oz)    Examination:  General exam: Appears calm and comfortable  Respiratory system: Clear to auscultation. Respiratory effort normal. Cardiovascular system: S1 & S2 heard, RRR. No JVD, murmurs, rubs, gallops or clicks. No pedal edema. Gastrointestinal system: Abdomen is nondistended, soft and nontender. No organomegaly or masses felt. Normal bowel sounds heard. Central nervous system: Alert and oriented. No focal neurological deficits. Extremities: Symmetric 5 x 5 power. Skin: No rashes, lesions or ulcers Psychiatry: Judgement and insight appear normal. Mood & affect appropriate.   Data Reviewed: I have personally reviewed following labs and  imaging studies  CBC:  Recent Labs Lab 11/11/16 1110 11/12/16 0627  WBC 13.7* 9.5  NEUTROABS 12.3*  --   HGB 12.6 11.2*  HCT 38.6 34.4*  MCV 83.4 83.7  PLT 401* 417*   Basic Metabolic Panel:  Recent Labs Lab 11/11/16 1110 11/12/16 0627 11/13/16 0644 11/14/16 0558  NA 133* 137 133* 135  K 3.3* 3.9 4.4 4.8  CL 95* 96* 95* 96*  CO2 27 28 27 29   GLUCOSE 136* 98 106* 108*  BUN 10 13 17 20   CREATININE 0.81 1.01* 1.07* 0.98  CALCIUM 8.7* 8.9 8.9 9.1    Cardiac Enzymes:  Recent Labs Lab 11/11/16 1110  TROPONINI <0.03   Sepsis Labs:  Recent Labs Lab 11/11/16 1110 11/11/16 1405 11/11/16 1932 11/13/16 0644  PROCALCITON  --   --  1.57 1.10  LATICACIDVEN 1.6 1.4  --   --     Recent Results (from the past 240 hour(s))    Culture, blood (routine x 2) Call MD if unable to obtain prior to antibiotics being given     Status: None (Preliminary result)   Collection Time: 11/11/16  7:31 PM  Result Value Ref Range Status   Specimen Description BLOOD RIGHT FOREARM  Final   Special Requests BOTTLES DRAWN AEROBIC AND ANAEROBIC 7CC EACH  Final   Culture NO GROWTH 3 DAYS  Final   Report Status PENDING  Incomplete  Culture, blood (routine x 2) Call MD if unable to obtain prior to antibiotics being given     Status: None (Preliminary result)   Collection Time: 11/11/16  7:32 PM  Result Value Ref Range Status   Specimen Description RIGHT ANTECUBITAL  Final   Special Requests BOTTLES DRAWN AEROBIC AND ANAEROBIC [facility]6CC EACH  Final   Culture NO GROWTH 3 DAYS  Final   Report Status PENDING  Incomplete     Radiology Studies: No results found.  Scheduled Meds: . aspirin EC  81 mg Oral Daily  . enoxaparin (LOVENOX) injection  40 mg Subcutaneous Q24H  . furosemide  40 mg Intravenous BID  . Influenza vac split quadrivalent PF  0.5 mL Intramuscular Tomorrow-1000  . levofloxacin  750 mg Oral Q24H  . pneumococcal 23 valent vaccine  0.5 mL Intramuscular Tomorrow-1000  . potassium chloride  40 mEq Oral BID  . sodium chloride flush  3 mL Intravenous Q12H   Continuous Infusions:   LOS: 2 days   Girtrude Enslin, MD FACP Hospitalist.   If 7PM-7AM, please contact night-coverage [email or web address] Password [email or web address] 11/14/2016, 2:53 PM

## 2016-11-15 DIAGNOSIS — I5031 Acute diastolic (congestive) heart failure: Secondary | ICD-10-CM

## 2016-11-15 LAB — BASIC METABOLIC PANEL
Anion gap: 11 (ref 5–15)
BUN: 20 mg/dL (ref 6–20)
CALCIUM: 9 mg/dL (ref 8.9–10.3)
CO2: 27 mmol/L (ref 22–32)
CREATININE: 1.02 mg/dL — AB (ref 0.44–1.00)
Chloride: 95 mmol/L — ABNORMAL LOW (ref 101–111)
GFR calc Af Amer: 59 mL/min — ABNORMAL LOW (ref >60)
GFR calc non Af Amer: 51 mL/min — ABNORMAL LOW (ref >60)
GLUCOSE: 88 mg/dL (ref 65–99)
Potassium: 5 mmol/L (ref 3.5–5.1)
Sodium: 133 mmol/L — ABNORMAL LOW (ref 135–145)

## 2016-11-15 MED ORDER — FUROSEMIDE 10 MG/ML IJ SOLN
40.0000 mg | Freq: Once | INTRAMUSCULAR | Status: AC
  Administered 2016-11-15: 40 mg via INTRAVENOUS
  Filled 2016-11-15: qty 4

## 2016-11-15 NOTE — Progress Notes (Signed)
PROGRESS NOTE    Heather Miller  VHQ:[id number] DOB: 01/01/1936 DOA: 11/11/2016 PCP: No PCP Per Patient    Brief Narrative: 81 yo with macular degeneration, blindness, hx of CHF, lives at home independently, admitted for CAP and CHF. She felt better with IV Lasix and IV Levaquin, along with oxygen. She has no complaints. Appreciate CM's help.  She will get HHA, RN, and PT if required.  Will see if she needs home oxygen as well. She is well at rest, but Desat on exertion with physical therapy.   Assessment & Plan:   Principal Problem:   Acute respiratory failure with hypoxia (HCC) Active Problems:   CAP (community acquired pneumonia)   Congestive heart failure (CHF) (HCC)   Hypokalemia   COPD exacerbation (HCC)  #1 acute respiratory failure with hypoxia  Admit to telemetry  Supplemental O2 via nasal cannula - no evidence of increased work of breathing   #2 community acquired pneumonia  Continue with IV Levoquin.    #3 congestive heart failure  Telemetry monitoring  Strict I/O  Daily Weights  Diuresis: Currently on Lasix 40mg  orally per day.  Will give one extra dose of IV Lasix today.  Potassium: 40mEq twice a day by mouth  ECHO showed EF 55 60% some inferobasal hypokinesis with grade 2 diastolic dysfx.   #4 hypokalemia  Potassium replacement  DVT prophylaxis:Lovenox Consultants:None Code Status:Full code Family Communication:None Disposition Plan:Pending  Consultants:   None.   Procedures:   None.   Antimicrobials: Anti-infectives    Start     Dose/Rate Route Frequency Ordered Stop   11/12/16 1500  levofloxacin (LEVAQUIN) tablet 750 mg     750 mg Oral Every 24 hours 11/11/16 1910 11/17/16 1459   11/11/16 1500  levofloxacin (LEVAQUIN) IVPB 750 mg     750 mg 100 mL/hr over 90 Minutes Intravenous  Once 11/11/16 1454 11/11/16 1710       Subjective:  Feeling better.  Asymptomatic at rest, but DOE.    Objective: Vitals:   11/14/16  1308 11/14/16 2000 11/15/16 0615 11/15/16 0935  BP: 120/74 108/73 95/60   Pulse: 100 (!) 104 (!) 106 (!) 130  Resp: 20 20 20    Temp: 98 F (36.7 C) 98.5 F (36.9 C) 98.1 F (36.7 C)   TempSrc: Oral Oral Oral   SpO2: 97% 95% 98% (!) 78%  Weight:   97 kg (213 lb 12.8 oz)   Height:        Intake/Output Summary (Last 24 hours) at 11/15/16 1304 Last data filed at 11/15/16 0900  Gross per 24 hour  Intake              360 ml  Output              350 ml  Net               10 ml   Filed Weights   11/12/16 0627 11/14/16 0623 11/15/16 0615  Weight: 96.9 kg (213 lb 11.2 oz) 94.2 kg (207 lb 11.2 oz) 97 kg (213 lb 12.8 oz)    Examination:  General exam: Appears calm and comfortable  Respiratory system: No wheezing, but basilar crackles.  Cardiovascular system: S1 & S2 heard, RRR. No JVD, murmurs, rubs, gallops or clicks. No pedal edema. Gastrointestinal system: Abdomen is nondistended, soft and nontender. No organomegaly or masses felt. Normal bowel sounds heard. Central nervous system: Alert and oriented. No focal neurological deficits. Extremities: Symmetric 5 x 5 power. Skin: No  rashes, lesions or ulcers Psychiatry: Judgement and insight appear normal. Mood & affect appropriate.   Data Reviewed: I have personally reviewed following labs and imaging studies  CBC:  Recent Labs Lab 11/11/16 1110 11/12/16 0627  WBC 13.7* 9.5  NEUTROABS 12.3*  --   HGB 12.6 11.2*  HCT 38.6 34.4*  MCV 83.4 83.7  PLT 401* 417*   Basic Metabolic Panel:  Recent Labs Lab 11/11/16 1110 11/12/16 0627 11/13/16 0644 11/14/16 0558 11/15/16 0428  NA 133* 137 133* 135 133*  K 3.3* 3.9 4.4 4.8 5.0  CL 95* 96* 95* 96* 95*  CO2 27 28 27 29 27   GLUCOSE 136* 98 106* 108* 88  BUN 10 13 17 20 20   CREATININE 0.81 1.01* 1.07* 0.98 1.02*  CALCIUM 8.7* 8.9 8.9 9.1 9.0   GFR: Cardiac Enzymes:  Recent Labs Lab 11/11/16 1110  TROPONINI <0.03    Sepsis Labs:  Recent Labs Lab 11/11/16 1110  11/11/16 1405 11/11/16 1932 11/13/16 0644  PROCALCITON  --   --  1.57 1.10  LATICACIDVEN 1.6 1.4  --   --     Recent Results (from the past 240 hour(s))  Culture, blood (routine x 2) Call MD if unable to obtain prior to antibiotics being given     Status: None (Preliminary result)   Collection Time: 11/11/16  7:31 PM  Result Value Ref Range Status   Specimen Description BLOOD RIGHT FOREARM  Final   Special Requests BOTTLES DRAWN AEROBIC AND ANAEROBIC 7CC EACH  Final   Culture NO GROWTH 4 DAYS  Final   Report Status PENDING  Incomplete  Culture, blood (routine x 2) Call MD if unable to obtain prior to antibiotics being given     Status: None (Preliminary result)   Collection Time: 11/11/16  7:32 PM  Result Value Ref Range Status   Specimen Description RIGHT ANTECUBITAL  Final   Special Requests BOTTLES DRAWN AEROBIC AND ANAEROBIC [facility] EACH  Final   Culture NO GROWTH 4 DAYS  Final   Report Status PENDING  Incomplete     Radiology Studies: No results found.  Scheduled Meds: . aspirin EC  81 mg Oral Daily  . enoxaparin (LOVENOX) injection  40 mg Subcutaneous Q24H  . furosemide  40 mg Intravenous Once  . furosemide  40 mg Oral Daily  . Influenza vac split quadrivalent PF  0.5 mL Intramuscular Tomorrow-1000  . levofloxacin  750 mg Oral Q24H  . pneumococcal 23 valent vaccine  0.5 mL Intramuscular Tomorrow-1000  . potassium chloride  40 mEq Oral BID  . sodium chloride flush  3 mL Intravenous Q12H   Continuous Infusions:   LOS: 3 days   Ceniya Fowers, MD FACP Hospitalist.   If 7PM-7AM, please contact night-coverage [email or web address] Password [email or web address] 11/15/2016, 1:04 PM

## 2016-11-15 NOTE — Clinical Social Work Note (Signed)
Clinical Social Work Assessment  Patient Details  Name: Heather Miller MRN: [id number] Date of Birth: 01/30/1936  Date of referral:  11/15/16               Reason for consult:  Facility Placement, Discharge Planning                Permission sought to share information with:  Case Manager, Magazine features editoracility Contact Representative, Family Supports Permission granted to share information::  Yes, Verbal Permission Granted  Name::        Agency::     Relationship::  both daughters  Contact Information:     Housing/Transportation Living arrangements for the past 2 months:  Single Family Home Source of Information:  Patient, Medical Team, Case Manager Patient Interpreter Needed:  None Criminal Activity/Legal Involvement Pertinent to Current Situation/Hospitalization:  No - Comment as needed Significant Relationships:  Adult Children, Other Family Members Lives with:  Self Do you feel safe going back to the place where you live?  No Need for family participation in patient care:  Yes (Comment)  Care giving concerns:  Patient lives alone at home and reports her daughter lives beside her, but she works during the day.  Reports she has never been this sick before and agreeable to recommendations with regards to SNF at discharge.  She reports she is using o2 for the first time and is hoping to wean down and get stronger so she can go home. [facility]   Social Worker assessment / plan:  LCSW received consult for SNF.  Discussed options with patient who is in agreement with plan. [facility] LCSW completed SNF work up. Will follow up with bed offers.   Employment status:  Retired Health and safety inspectornsurance information:  Medicare PT Recommendations:  Skilled Nursing Facility Information / Referral to community resources:  Skilled Nursing Facility  Patient/Family's Response to care:  Agreeable to plan  Patient/Family's Understanding of and Emotional Response to Diagnosis, Current  Treatment, and Prognosis:  Patient understanding of her illness and with regards to recommendations and safety concerns.  Emotional Assessment Appearance:  Appears stated age Attitude/Demeanor/Rapport:    Affect (typically observed):  Accepting, Adaptable Orientation:  Oriented to Self, Oriented to Place, Oriented to Situation Alcohol / Substance use:  Not Applicable Psych involvement (Current and /or in the community):  No (Comment)  Discharge Needs  Concerns to be addressed:  No discharge needs identified Readmission within the last 30 days:  No Current discharge risk:  None Barriers to Discharge:  Continued Medical Work up   Raye SorrowCoble, Xan Ingraham N, LCSW 11/15/2016, 2:52 PM

## 2016-11-15 NOTE — Evaluation (Signed)
Physical Therapy Evaluation Patient Details Name: Heather Miller MRN: [id number] DOB: 1935-12-12 Today's Date: 11/15/2016   History of Present Illness  81 y.o. female with a history of arthritis, blindness secondary to macular degeneration, kidney stones. Patient seen with 2 weeks of worsening shortness of breath, productive cough. Her symptoms have been worsening. Symptoms worse with ambulation and improved with rest. She was initially 80% on room air upon arrival patient received nebulizer treatment en route. Her symptoms improved, however the patient still desaturated with attempting to stand with orthostatic vital signs. She also endorses left-sided chest pain. She denies leg swelling, weight gain.   chest x-ray suspicious for pneumonia and congestive heart failure.  Clinical Impression  Pt received in bed, and was agreeable to PT evaluation.  Pt is blind at baseline, but states she can sense light.  Pt states that she is normally modified independent with a cane for household ambulation.  She is independent with dressing and bathing, and her dtr's and family assist with running errands.  During today's evaluation, she required Min A for sit<>stand with RW, and transfer bed<>BSC.  Pt then attempted gait, but was only able to take 3 steps before demonstrating significant SpO2 desaturation to 78% on 3L, as well as elevated HR up to 130bpm.  Chair pulled up behind pt, and encouraged deep purse lipped breathing, but SpO2 still in the 80's.  Increased O2 to 4L, and MaryAnn, RN present.  Pt was able to both cough up and blow out her nose a good amount of mucous during tx.  Pt was left sitting up in the chair with RN present to finish administering meds.  Dr. Conley Rolls notified of pt's Spo2, and HR during mobility.  Recommend SNF at this point due to increased need for assistance and fatigue.      Follow Up Recommendations SNF    Equipment Recommendations  None recommended by PT    Recommendations for Other  Services       Precautions / Restrictions Precautions Precautions: Fall Precaution Comments: due to immobility Restrictions Weight Bearing Restrictions: No      Mobility  Bed Mobility Overal bed mobility: Modified Independent                Transfers Overall transfer level: Needs assistance Equipment used: Rolling walker (2 wheeled) Transfers: Sit to/from [employer] Sit to Stand: Min assist Stand pivot transfers: Min assist       General transfer comment: Pt was able to transfer to the [facility].  Then attempted to take a few steps for gait, however, pt desaturated to 78% and HR elevated to 130bpm.  Chair pulled up behind her to sit and rest.  Pt also coughing up mucous, and blowing nose multiple times.   Ambulation/Gait                Stairs            Wheelchair Mobility    Modified Rankin (Stroke Patients Only)       Balance Overall balance assessment: Needs assistance Sitting-balance support: Feet supported;Bilateral upper extremity supported Sitting balance-Leahy Scale: Good     Standing balance support: Bilateral upper extremity supported Standing balance-Leahy Scale: Poor                               Pertinent Vitals/Pain Pain Assessment: No/denies pain    Home Living   Living Arrangements: Alone Available Help at Discharge:  Family (dtr lives next door.  Dtr is at her house on a daily basis.  ) Type of Home: House Home Access: Stairs to enter   [employer]trance Stairs-Number of Steps: 1 Home Layout: One level Home Equipment: Cane - single point;Walker - standard      Prior Function     Gait / Transfers Assistance Needed: Pt states she ambulates with the cane off and on.  Household ambulation distances.    ADL's / Homemaking Assistance Needed: independent with dressing and bathing.  Dtr, grandsons, and other family members assist with errands        Hand Dominance   Dominant Hand: Right     Extremity/Trunk Assessment   Upper Extremity Assessment Upper Extremity Assessment: Generalized weakness    Lower Extremity Assessment Lower Extremity Assessment: Generalized weakness;RLE deficits/detail RLE Deficits / Details: Knee contracture at baseline - pt states she cannot fully straighten.  Pt also notes that R LE is swollen.     Cervical / Trunk Assessment Cervical / Trunk Assessment: Kyphotic  Communication   Communication: No difficulties;Other (comment) (Pt is blind, but states she can sense light. )  Cognition Arousal/Alertness: Awake/alert Behavior During Therapy: WFL for tasks assessed/performed Overall Cognitive Status: Within Functional Limits for tasks assessed                      General Comments      Exercises     Assessment/Plan    PT Assessment Patient needs continued PT services  PT Problem List Decreased strength;Decreased range of motion;Decreased activity tolerance;Decreased balance;Decreased mobility;Decreased knowledge of use of DME;Decreased safety awareness;Decreased knowledge of precautions;Cardiopulmonary status limiting activity;Pain;Obesity          PT Treatment Interventions DME instruction;Gait training;Functional mobility training;Therapeutic activities;Therapeutic exercise;Balance training;Patient/family education    PT Goals (Current goals can be found in the Care Plan section)  Acute Rehab PT Goals Patient Stated Goal: None stated PT Goal Formulation: With patient Time For Goal Achievement: 11/22/16 Potential to Achieve Goals: Fair    Frequency Min 3X/week   Barriers to discharge Decreased caregiver support Pt lives alone    Co-evaluation               End of Session Equipment Utilized During Treatment: Gait belt;Oxygen Activity Tolerance: Patient limited by fatigue;Treatment limited secondary to medical complications (Comment) (SpO2 desaturation to 78%, as well as elevated HR to 130bpm) Patient left: in  chair;with call bell/phone within reach;with nursing/sitter in room Nurse Communication: Mobility status Nita Sells(MaryAnn, RN present in the room during mobiltiy and aware of SpO2 desaturation, as well as elevated HR.  )    Functional Assessment Tool Used: [employer] AM-PAC "6-clicks"  Functional Limitation: Mobility: Walking and moving around Mobility: Walking and Moving Around Current Status [phone](G8978): At least 40 percent but less than 60 percent impaired, limited or restricted Mobility: Walking and Moving Around Goal Status [phone](G8979): At least 20 percent but less than 40 percent impaired, limited or restricted    Time: 0841-0933 PT Time Calculation (min) (ACUTE ONLY): 52 min   Charges:   PT Evaluation $PT Eval Moderate Complexity: 1 Procedure PT Treatments $Therapeutic Exercise: 23-37 mins   PT G Codes:   PT G-Codes **NOT FOR INPATIENT CLASS** Functional Assessment Tool Used: [employer] AM-PAC "6-clicks"  Functional Limitation: Mobility: Walking and moving around Mobility: Walking and Moving Around Current Status [phone]): At least 40 percent but less than 60 percent impaired, limited or restricted Mobility: Walking and Moving Around Goal Status [phone](G8979):  At least 20 percent but less than 40 percent impaired, limited or restricted   Beth Shirell Struthers, PT, DPT X: [phone]

## 2016-11-15 NOTE — [place] FL2 (Signed)
[place] MEDICAID FL2 LEVEL OF CARE SCREENING TOOL     IDENTIFICATION  Patient Name: Heather Miller Birthdate: 12/18/1935 Sex: female Admission Date (Current Location): 11/11/2016  [facility] [place]   Facility and Address:  [facility],  [address], [place] [id number]      Provider Number: [id number]  Attending Physician Name and Address:  Houston SirenPeter Le, MD  Relative Name and Phone Number:       Current Level of Care: Hospital Recommended Level of Care: Skilled Nursing Facility Prior Approval Number:    Date Approved/Denied:   PASRR Number:   [id number]  Discharge Plan: SNF    Current Diagnoses: Patient Active Problem List   Diagnosis Date Noted  . COPD exacerbation (HCC) 11/12/2016  . Acute respiratory failure with hypoxia (HCC) 11/11/2016  . CAP (community acquired pneumonia) 11/11/2016  . Congestive heart failure (CHF) (HCC) 11/11/2016  . Hypokalemia 11/11/2016  . MACULAR DEGENERATION 07/15/2009  . CHEST PAIN 07/15/2009    Orientation RESPIRATION BLADDER Height & Weight     Self, Situation, Place, Time  O2 (3L) Continent Weight: 213 lb 12.8 oz (97 kg) Height:  5\' 8"  (172.7 cm)  BEHAVIORAL SYMPTOMS/MOOD NEUROLOGICAL BOWEL NUTRITION STATUS      Continent Diet (see dc summary)  AMBULATORY STATUS COMMUNICATION OF NEEDS Skin   Extensive Assist Verbally Normal                       Personal Care Assistance Level of Assistance  Bathing, Feeding, Dressing Bathing Assistance: Maximum assistance Feeding assistance: Limited assistance Dressing Assistance: Maximum assistance     Functional Limitations Info  Sight, Hearing, Speech Sight Info: Adequate Hearing Info: Adequate Speech Info: Adequate    SPECIAL CARE FACTORS FREQUENCY  PT (By licensed PT), OT (By licensed OT)     PT Frequency: 5x OT Frequency: 5x            Contractures Contractures Info: Not present    Additional Factors Info  Code Status,  Allergies Code Status Info: Full Code Allergies Info: NKA           Current Medications (11/15/2016):  This is the current hospital active medication list Current Facility-Administered Medications  Medication Dose Route Frequency Provider Last Rate Last Dose  . 0.9 %  sodium chloride infusion  250 mL Intravenous PRN Rhona RaiderJacob J Stinson, DO      . acetaminophen (TYLENOL) tablet 650 mg  650 mg Oral Q4H PRN Rhona RaiderJacob J Stinson, DO      . aspirin EC tablet 81 mg  81 mg Oral Daily Rhona RaiderJacob J Stinson, DO   81 mg at 11/15/16 [id number]  . enoxaparin (LOVENOX) injection 40 mg  40 mg Subcutaneous Q24H Rhona RaiderJacob J Stinson, DO   40 mg at 11/14/16 2020  . furosemide (LASIX) tablet 40 mg  40 mg Oral Daily Houston SirenPeter Le, MD   40 mg at 11/15/16 [id number]  . Influenza vac split quadrivalent PF (FLUARIX) injection 0.5 mL  0.5 mL Intramuscular Tomorrow-1000 Rhona RaiderJacob J Stinson, DO      . levofloxacin [facility](LEVAQUIN) tablet 750 mg  750 mg Oral Q24H Rhona RaiderJacob J Stinson, DO   750 mg at 11/14/16 2020  . ondansetron (ZOFRAN) injection 4 mg  4 mg Intravenous Q6H PRN Rhona RaiderJacob J Stinson, DO      . pneumococcal 23 valent vaccine (PNU-IMMUNE) injection 0.5 mL  0.5 mL Intramuscular Tomorrow-1000 Rhona RaiderJacob J Stinson, DO      . potassium chloride  SA (K-DUR,KLOR-CON) CR tablet 40 mEq  40 mEq Oral BID Rhona Raider Stinson, DO   40 mEq at 11/15/16 0919  . sodium chloride flush (NS) 0.9 % injection 3 mL  3 mL Intravenous Q12H Rhona Raider Stinson, DO   3 mL at 11/15/16 0919  . sodium chloride flush (NS) 0.9 % injection 3 mL  3 mL Intravenous PRN Levie Heritage, DO         Discharge Medications: Please see discharge summary for a list of discharge medications.  Relevant Imaging Results:  Relevant Lab Results:   Additional Information SSN: [id number]  Raye Sorrow, [place]

## 2016-11-15 NOTE — Clinical Social Work Placement (Signed)
   CLINICAL SOCIAL WORK PLACEMENT  NOTE  Date:  11/15/2016  Patient Details  Name: Heather Miller MRN: [id number] Date of Birth: 05/19/1936  Clinical Social Work is seeking post-discharge placement for this patient at the Skilled  Nursing Facility level of care (*CSW will initial, date and re-position this form in  chart as items are completed):  Yes   Patient/family provided with [place] Clinical Social Work Department's list of facilities offering this level of care within the geographic area requested by the patient (or if unable, by the patient's family).  Yes   Patient/family informed of their freedom to choose among providers that offer the needed level of care, that participate in Medicare, Medicaid or managed care program needed by the patient, have an available bed and are willing to accept the patient.  Yes   Patient/family informed of [facility]'s ownership interest in [facility] and [facility], as well as of the fact that they are under no obligation to receive care at these facilities.  PASRR submitted to EDS on 11/15/16     PASRR number received on 11/15/16     Existing PASRR number confirmed on 11/15/16     FL2 transmitted to all facilities in geographic area requested by pt/family on       FL2 transmitted to all facilities within larger geographic area on       Patient informed that his/her managed care company has contracts with or will negotiate with certain facilities, including the following:            Patient/family informed of bed offers received.  Patient chooses bed at       Physician recommends and patient chooses bed at      Patient to be transferred to   on  .  Patient to be transferred to facility by       Patient family notified on   of transfer.  Name of family member notified:        PHYSICIAN Please sign FL2     Additional Comment:    _______________________________________________ Raye Sorrowoble, Cristal Qadir N, LCSW 11/15/2016, 3:15  PM

## 2016-11-16 LAB — CULTURE, BLOOD (ROUTINE X 2)
Culture: NO GROWTH
Culture: NO GROWTH

## 2016-11-16 LAB — BASIC METABOLIC PANEL
Anion gap: 11 (ref 5–15)
BUN: 22 mg/dL — ABNORMAL HIGH (ref 6–20)
CHLORIDE: 94 mmol/L — AB (ref 101–111)
CO2: 28 mmol/L (ref 22–32)
CREATININE: 1.14 mg/dL — AB (ref 0.44–1.00)
Calcium: 9.2 mg/dL (ref 8.9–10.3)
GFR, EST AFRICAN AMERICAN: 52 mL/min — AB (ref >60)
GFR, EST NON AFRICAN AMERICAN: 45 mL/min — AB (ref >60)
Glucose, Bld: 103 mg/dL — ABNORMAL HIGH (ref 65–99)
Potassium: 5.1 mmol/L (ref 3.5–5.1)
SODIUM: 133 mmol/L — AB (ref 135–145)

## 2016-11-16 MED ORDER — ORAL CARE MOUTH RINSE
15.0000 mL | Freq: Two times a day (BID) | OROMUCOSAL | Status: DC
  Administered 2016-11-16 – 2016-11-24 (×17): 15 mL via OROMUCOSAL

## 2016-11-16 MED ORDER — GUAIFENESIN ER 600 MG PO TB12
1200.0000 mg | ORAL_TABLET | Freq: Two times a day (BID) | ORAL | Status: DC
  Administered 2016-11-16 – 2016-11-25 (×18): 1200 mg via ORAL
  Filled 2016-11-16 (×17): qty 2

## 2016-11-16 MED ORDER — IPRATROPIUM-ALBUTEROL 0.5-2.5 (3) MG/3ML IN SOLN
3.0000 mL | Freq: Four times a day (QID) | RESPIRATORY_TRACT | Status: DC
  Administered 2016-11-16 – 2016-11-17 (×2): 3 mL via RESPIRATORY_TRACT
  Filled 2016-11-16 (×2): qty 3

## 2016-11-16 MED ORDER — FUROSEMIDE 10 MG/ML IJ SOLN
40.0000 mg | Freq: Two times a day (BID) | INTRAMUSCULAR | Status: DC
  Administered 2016-11-16 – 2016-11-19 (×6): 40 mg via INTRAVENOUS
  Filled 2016-11-16 (×6): qty 4

## 2016-11-16 NOTE — Progress Notes (Signed)
PROGRESS NOTE    Heather Miller  ZOX:[id number] DOB: April 06, 1936 DOA: 11/11/2016 PCP: No PCP Per Patient    Brief Narrative: 81 yo with macular degeneration, blindness, hx of CHF, lives at home independently, admitted for CAP and CHF. She felt better with IV Lasix and IV Levaquin, along with oxygen. She has no complaints. Appreciate CM's help.  She will get HHA, RN, and PT if required.  Will see if she needs home oxygen as well. She is well at rest, but Desat on exertion with physical therapy.   Assessment & Plan:   Principal Problem:   Acute respiratory failure with hypoxia (HCC) Active Problems:   CAP (community acquired pneumonia)   Congestive heart failure (CHF) (HCC)   Hypokalemia   COPD exacerbation (HCC)  #1 acute respiratory failure with hypoxia  Supplemental O2 via nasal cannula - no evidence of increased work of breathing. Wean off oxygen as tolerated   #2 community acquired pneumonia  She's completed 5 days of Levaquin..    #3 acute diastolic congestive heart failure  Telemetry monitoring  Strict I/O  Daily Weights  Diuresis: She continues to have evidence of volume overload. We will treat with intravenous Lasix.  ECHO showed EF 55 60% some inferobasal hypokinesis with grade 2 diastolic dysfx.   Repeat chest x-ray in the morning.  #4 hypokalemia  Replaced  DVT prophylaxis:Lovenox Consultants:None Code Status:Full code Family Communication:None Disposition Plan:Skilled nursing facility placement when stable.  Consultants:   None.   Procedures:   None.   Antimicrobials: Anti-infectives    Start     Dose/Rate Route Frequency Ordered Stop   11/12/16 1500  levofloxacin (LEVAQUIN) tablet 750 mg     750 mg Oral Every 24 hours 11/11/16 1910 11/16/16 1449   11/11/16 1500  levofloxacin (LEVAQUIN) IVPB 750 mg     750 mg 100 mL/hr over 90 Minutes Intravenous  Once 11/11/16 1454 11/11/16 1710      Subjective: Becomes very short of breath on  exertion. No chest pain.   Objective: Vitals:   11/15/16 1346 11/15/16 2136 11/16/16 0445 11/16/16 0951  BP: (!) 100/55 130/76 114/63 101/65  Pulse: (!) 110 (!) 118 (!) 109 (!) 128  Resp: 20 20 18    Temp: 97.5 F (36.4 C) 98.7 F (37.1 C) 98.9 F (37.2 C) 97.7 F (36.5 C)  TempSrc: Oral Oral Oral Oral  SpO2: 96% 92% 94% 90%  Weight:   94.1 kg (207 lb 8 oz)   Height:        Intake/Output Summary (Last 24 hours) at 11/16/16 1916 Last data filed at 11/16/16 1318  Gross per 24 hour  Intake                0 ml  Output              500 ml  Net             -500 ml   Filed Weights   11/14/16 0623 11/15/16 0615 11/16/16 0445  Weight: 94.2 kg (207 lb 11.2 oz) 97 kg (213 lb 12.8 oz) 94.1 kg (207 lb 8 oz)    Examination:  General exam: Appears calm and comfortable  Respiratory system: Bilateral crackles.  Cardiovascular system: S1 & S2 heard, RRR. No JVD, murmurs, rubs, gallops or clicks. 1+ pedal edema. Gastrointestinal system: Abdomen is nondistended, soft and nontender. No organomegaly or masses felt. Normal bowel sounds heard. Central nervous system: Alert and oriented. No focal neurological deficits. Extremities: Symmetric 5  x 5 power. Skin: No rashes, lesions or ulcers Psychiatry: Judgement and insight appear normal. Mood & affect appropriate.   Data Reviewed: I have personally reviewed following labs and imaging studies  CBC:  Recent Labs Lab 11/11/16 1110 11/12/16 0627  WBC 13.7* 9.5  NEUTROABS 12.3*  --   HGB 12.6 11.2*  HCT 38.6 34.4*  MCV 83.4 83.7  PLT 401* 417*   Basic Metabolic Panel:  Recent Labs Lab 11/12/16 0627 11/13/16 0644 11/14/16 0558 11/15/16 0428 11/16/16 0440  NA 137 133* 135 133* 133*  K 3.9 4.4 4.8 5.0 5.1  CL 96* 95* 96* 95* 94*  CO2 28 27 29 27 28   GLUCOSE 98 106* 108* 88 103*  BUN 13 17 20 20  22*  CREATININE 1.01* 1.07* 0.98 1.02* 1.14*  CALCIUM 8.9 8.9 9.1 9.0 9.2   GFR: Cardiac Enzymes:  Recent Labs Lab 11/11/16 1110   TROPONINI <0.03    Sepsis Labs:  Recent Labs Lab 11/11/16 1110 11/11/16 1405 11/11/16 1932 11/13/16 0644  PROCALCITON  --   --  1.57 1.10  LATICACIDVEN 1.6 1.4  --   --     Recent Results (from the past 240 hour(s))  Culture, blood (routine x 2) Call MD if unable to obtain prior to antibiotics being given     Status: None   Collection Time: 11/11/16  7:31 PM  Result Value Ref Range Status   Specimen Description BLOOD RIGHT FOREARM  Final   Special Requests BOTTLES DRAWN AEROBIC AND ANAEROBIC [facility] EACH  Final   Culture NO GROWTH 5 DAYS  Final   Report Status 11/16/2016 FINAL  Final  Culture, blood (routine x 2) Call MD if unable to obtain prior to antibiotics being given     Status: None   Collection Time: 11/11/16  7:32 PM  Result Value Ref Range Status   Specimen Description RIGHT ANTECUBITAL  Final   Special Requests BOTTLES DRAWN AEROBIC AND ANAEROBIC [facility] EACH  Final   Culture NO GROWTH 5 DAYS  Final   Report Status 11/16/2016 FINAL  Final     Radiology Studies: No results found.  Scheduled Meds: . aspirin EC  81 mg Oral Daily  . enoxaparin (LOVENOX) injection  40 mg Subcutaneous Q24H  . furosemide  40 mg Intravenous BID  . furosemide  40 mg Oral Daily  . guaiFENesin  1,200 mg Oral BID  . Influenza vac split quadrivalent PF  0.5 mL Intramuscular Tomorrow-1000  . ipratropium-albuterol  3 mL Nebulization Q6H  . pneumococcal 23 valent vaccine  0.5 mL Intramuscular Tomorrow-1000  . sodium chloride flush  3 mL Intravenous Q12H   Continuous Infusions:   LOS: 4 days   Vernona Peake, MD FACP Hospitalist.   If 7PM-7AM, please contact night-coverage [email or web address] Password [email or web address] 11/16/2016, 7:16 PM

## 2016-11-16 NOTE — Clinical Social Work Placement (Signed)
   CLINICAL SOCIAL WORK PLACEMENT  NOTE  Date:  11/16/2016  Patient Details  Name: Lonny PrudeSara P Saldierna MRN: [id number] Date of Birth: 08/05/1936  Clinical Social Work is seeking post-discharge placement for this patient at the Skilled  Nursing Facility level of care (*CSW will initial, date and re-position this form in  chart as items are completed):  Yes   Patient/family provided with [place] Clinical Social Work Department's list of facilities offering this level of care within the geographic area requested by the patient (or if unable, by the patient's family).  Yes   Patient/family informed of their freedom to choose among providers that offer the needed level of care, that participate in Medicare, Medicaid or managed care program needed by the patient, have an available bed and are willing to accept the patient.  Yes   Patient/family informed of [facility]'s ownership interest in [facility] and [facility], as well as of the fact that they are under no obligation to receive care at these facilities.  PASRR submitted to EDS on 11/15/16     PASRR number received on 11/15/16     Existing PASRR number confirmed on 11/15/16     FL2 transmitted to all facilities in geographic area requested by pt/family on       FL2 transmitted to all facilities within larger geographic area on       Patient informed that his/her managed care company has contracts with or will negotiate with certain facilities, including the following:        Yes   Patient/family informed of bed offers received.  [facility]     Physician recommends and patient chooses bed at      [facility] on  .  Patient to be transferred to facility by       Patient family notified on   of transfer.  Name of family member notified:        PHYSICIAN Please sign FL2     Additional Comment:     _______________________________________________ Karn CassisStultz, Makynli Stills [place], LCSW 11/16/2016, 11:34 AM [phone]

## 2016-11-16 NOTE — Progress Notes (Signed)
Physical Therapy Treatment Patient Details Name: Heather Miller MRN: [id number] DOB: June 28, 1936 Today's Date: 11/16/2016    History of Present Illness 81 y.o. female with a history of arthritis, blindness secondary to macular degeneration, kidney stones. Patient seen with 2 weeks of worsening shortness of breath, productive cough. Her symptoms have been worsening. Symptoms worse with ambulation and improved with rest. She was initially 80% on room air upon arrival patient received nebulizer treatment en route. Her symptoms improved, however the patient still desaturated with attempting to stand with orthostatic vital signs. She also endorses left-sided chest pain. She denies leg swelling, weight gain.   chest x-ray suspicious for pneumonia and congestive heart failure.    PT Comments    Pt received in bed and was agreeable to PT tx.  Pt required Min A for sit<>stand with RW and was able to transfer from the bed<>BSC<>chair.  Pt is limited with further mobility due to SpO2 desaturation down to 82% with mobility while on 2L, as well as elevated HR up to 130bpm.  Continue to recommend SNF to progress pt's strength and endurance as pt was living alone prior to admission.    Follow Up Recommendations  SNF     Equipment Recommendations  None recommended by PT    Recommendations for Other Services       Precautions / Restrictions Precautions Precautions: Fall Precaution Comments: due to immobility Restrictions Weight Bearing Restrictions: No    Mobility  Bed Mobility Overal bed mobility: Modified Independent             General bed mobility comments: Once sitting on the EOB, pt desaturates to 87% with talking, and encouraged to perform PLB, and improved back to 90% on 2L.    Transfers Overall transfer level: Needs assistance Equipment used: Rolling walker (2 wheeled) Transfers: Sit to/from Stand Sit to Stand: Min assist Stand pivot transfers: Min assist       General transfer  comment: Pt demonstrates desaturation to 82% on 2L during transfer bed<>BSC<>chair.   Ambulation/Gait Ambulation/Gait assistance:  (NA due to SpO2 desaturation. )               Stairs            Wheelchair Mobility    Modified Rankin (Stroke Patients Only)       Balance Overall balance assessment: Needs assistance Sitting-balance support: Feet supported;Bilateral upper extremity supported Sitting balance-Leahy Scale: Good     Standing balance support: Bilateral upper extremity supported Standing balance-Leahy Scale: Poor                      Cognition Arousal/Alertness: Awake/alert Behavior During Therapy: WFL for tasks assessed/performed Overall Cognitive Status: Within Functional Limits for tasks assessed                      Exercises      General Comments        Pertinent Vitals/Pain Pain Assessment: No/denies pain    Home Living                      Prior Function            PT Goals (current goals can now be found in the care plan section) Acute Rehab PT Goals Patient Stated Goal: None stated PT Goal Formulation: With patient Potential to Achieve Goals: Fair Progress towards PT goals: Progressing toward goals    Frequency    Min  3X/week      PT Plan Current plan remains appropriate    Co-evaluation             End of Session Equipment Utilized During Treatment: Gait belt;Oxygen Activity Tolerance: Patient limited by fatigue Patient left: in chair;with call bell/phone within reach     Time: 1530-1600 PT Time Calculation (min) (ACUTE ONLY): 30 min  Charges:  $Therapeutic Activity: 8-22 mins $Self Care/Home Management: 8-22                    G Codes:      Beth Yanessa Hocevar, PT, DPT X: [phone]

## 2016-11-17 ENCOUNTER — Inpatient Hospital Stay (HOSPITAL_COMMUNITY): Payer: Medicare Other

## 2016-11-17 LAB — BASIC METABOLIC PANEL
Anion gap: 11 (ref 5–15)
BUN: 22 mg/dL — ABNORMAL HIGH (ref 6–20)
CHLORIDE: 92 mmol/L — AB (ref 101–111)
CO2: 29 mmol/L (ref 22–32)
CREATININE: 1.29 mg/dL — AB (ref 0.44–1.00)
Calcium: 9.1 mg/dL (ref 8.9–10.3)
GFR calc Af Amer: 45 mL/min — ABNORMAL LOW (ref >60)
GFR calc non Af Amer: 39 mL/min — ABNORMAL LOW (ref >60)
GLUCOSE: 108 mg/dL — AB (ref 65–99)
POTASSIUM: 4.5 mmol/L (ref 3.5–5.1)
SODIUM: 132 mmol/L — AB (ref 135–145)

## 2016-11-17 MED ORDER — IPRATROPIUM-ALBUTEROL 0.5-2.5 (3) MG/3ML IN SOLN
3.0000 mL | Freq: Three times a day (TID) | RESPIRATORY_TRACT | Status: DC
  Administered 2016-11-17 – 2016-11-18 (×4): 3 mL via RESPIRATORY_TRACT
  Filled 2016-11-17 (×4): qty 3

## 2016-11-17 NOTE — Progress Notes (Signed)
PROGRESS NOTE    Heather Miller  ZOX:[id number] DOB: 01/08/1936 DOA: 11/11/2016 PCP: No PCP Per Patient    Brief Narrative: 81 yo with macular degeneration, blindness, hx of CHF, lives at home independently, admitted for CAP and CHF. She felt better with IV Lasix and IV Levaquin, along with oxygen. She has no complaints. Appreciate CM's help.  She will get HHA, RN, and PT if required.  Will see if she needs home oxygen as well. She is well at rest, but Desat on exertion with physical therapy.   Assessment & Plan:   Principal Problem:   Acute respiratory failure with hypoxia (HCC) Active Problems:   CAP (community acquired pneumonia)   Congestive heart failure (CHF) (HCC)   Hypokalemia   COPD exacerbation (HCC)  #1 acute respiratory failure with hypoxia  Supplemental O2 via nasal cannula - no evidence of increased work of breathing. Wean off oxygen as tolerated   #2 community acquired pneumonia  She's completed 5 days of Levaquin..    #3 acute diastolic congestive heart failure  Telemetry monitoring  Strict I/O  Daily Weights  Diuresis: She continues to have evidence of volume overload. We will treat with intravenous Lasix.  ECHO showed EF 55 60% some inferobasal hypokinesis with grade 2 diastolic dysfx.   Repeat chest x-ray this morning does not show significant change from admission  #4 hypokalemia  Replaced  DVT prophylaxis:Lovenox Consultants:None Code Status:Full code Family Communication:None Disposition Plan:Skilled nursing facility placement when stable.  Consultants:   None.   Procedures:   None.   Antimicrobials: Anti-infectives    Start     Dose/Rate Route Frequency Ordered Stop   11/12/16 1500  levofloxacin (LEVAQUIN) tablet 750 mg     750 mg Oral Every 24 hours 11/11/16 1910 11/16/16 1449   11/11/16 1500  levofloxacin (LEVAQUIN) IVPB 750 mg     750 mg 100 mL/hr over 90 Minutes Intravenous  Once 11/11/16 1454 11/11/16 1710       Subjective: feels breathing is a little better today. No chest pain   Objective: Vitals:   11/16/16 2030 11/17/16 0405 11/17/16 0455 11/17/16 0741  BP: (!) 98/58  112/61   Pulse: (!) 109  (!) 108   Resp: 20  20   Temp: 99.2 F (37.3 C)  97.4 F (36.3 C)   TempSrc: Oral  Oral   SpO2: 98% 97% 94% 95%  Weight:   98.6 kg (217 lb 4.8 oz)   Height:        Intake/Output Summary (Last 24 hours) at 11/17/16 1150 Last data filed at 11/17/16 0457  Gross per 24 hour  Intake              366 ml  Output             1100 ml  Net             -734 ml   Filed Weights   11/15/16 0615 11/16/16 0445 11/17/16 0455  Weight: 97 kg (213 lb 12.8 oz) 94.1 kg (207 lb 8 oz) 98.6 kg (217 lb 4.8 oz)    Examination:  General exam: Appears calm and comfortable  Respiratory system: Bilateral crackles, mildly improving from yesterday.  Cardiovascular system: S1 & S2 heard, RRR. No JVD, murmurs, rubs, gallops or clicks. trace pedal edema. Gastrointestinal system: Abdomen is nondistended, soft and nontender. No organomegaly or masses felt. Normal bowel sounds heard. Central nervous system: Alert and oriented. No focal neurological deficits. Extremities: Symmetric 5 x  5 power. Skin: No rashes, lesions or ulcers Psychiatry: Judgement and insight appear normal. Mood & affect appropriate.   Data Reviewed: I have personally reviewed following labs and imaging studies  CBC:  Recent Labs Lab 11/11/16 1110 11/12/16 0627  WBC 13.7* 9.5  NEUTROABS 12.3*  --   HGB 12.6 11.2*  HCT 38.6 34.4*  MCV 83.4 83.7  PLT 401* 417*   Basic Metabolic Panel:  Recent Labs Lab 11/13/16 0644 11/14/16 0558 11/15/16 0428 11/16/16 0440 11/17/16 0607  NA 133* 135 133* 133* 132*  K 4.4 4.8 5.0 5.1 4.5  CL 95* 96* 95* 94* 92*  CO2 27 29 27 28 29   GLUCOSE 106* 108* 88 103* 108*  BUN 17 20 20  22* 22*  CREATININE 1.07* 0.98 1.02* 1.14* 1.29*  CALCIUM 8.9 9.1 9.0 9.2 9.1   GFR: Cardiac Enzymes:  Recent  Labs Lab 11/11/16 1110  TROPONINI <0.03    Sepsis Labs:  Recent Labs Lab 11/11/16 1110 11/11/16 1405 11/11/16 1932 11/13/16 0644  PROCALCITON  --   --  1.57 1.10  LATICACIDVEN 1.6 1.4  --   --     Recent Results (from the past 240 hour(s))  Culture, blood (routine x 2) Call MD if unable to obtain prior to antibiotics being given     Status: None   Collection Time: 11/11/16  7:31 PM  Result Value Ref Range Status   Specimen Description BLOOD RIGHT FOREARM  Final   Special Requests BOTTLES DRAWN AEROBIC AND ANAEROBIC [facility] EACH  Final   Culture NO GROWTH 5 DAYS  Final   Report Status 11/16/2016 FINAL  Final  Culture, blood (routine x 2) Call MD if unable to obtain prior to antibiotics being given     Status: None   Collection Time: 11/11/16  7:32 PM  Result Value Ref Range Status   Specimen Description RIGHT ANTECUBITAL  Final   Special Requests BOTTLES DRAWN AEROBIC AND ANAEROBIC [facility] EACH  Final   Culture NO GROWTH 5 DAYS  Final   Report Status 11/16/2016 FINAL  Final     Radiology Studies: Dg Chest Port 1 View  Result Date: 11/17/2016 CLINICAL DATA:  Respiratory failure. EXAM: PORTABLE CHEST 1 VIEW COMPARISON:  11/11/2016. FINDINGS: Mediastinum is stable. Heart size stable. Persistent unchanged bilateral pulmonary infiltrates and/or edema. Low lung volumes. No prominent pleural effusion. No pneumothorax . Stable elevation left hemidiaphragm. Interposition of the colon left hemidiaphragm again noted. IMPRESSION: Persistent prominent bilateral pulmonary infiltrates and or edema. No significant change. Electronically Signed   By: Maisie Fus  Register   On: 11/17/2016 07:30    Scheduled Meds: . aspirin EC  81 mg Oral Daily  . enoxaparin (LOVENOX) injection  40 mg Subcutaneous Q24H  . furosemide  40 mg Intravenous BID  . furosemide  40 mg Oral Daily  . guaiFENesin  1,200 mg Oral BID  . Influenza vac split quadrivalent PF  0.5 mL Intramuscular Tomorrow-1000  .  ipratropium-albuterol  3 mL Nebulization TID  . mouth rinse  15 mL Mouth Rinse BID  . pneumococcal 23 valent vaccine  0.5 mL Intramuscular Tomorrow-1000  . sodium chloride flush  3 mL Intravenous Q12H   Continuous Infusions:   LOS: 5 days   Wanona Stare, MD  Hospitalist.   If 7PM-7AM, please contact night-coverage [email or web address] Password [email or web address] 11/17/2016, 11:50 AM

## 2016-11-18 LAB — BASIC METABOLIC PANEL
Anion gap: 12 (ref 5–15)
BUN: 22 mg/dL — AB (ref 6–20)
CALCIUM: 9 mg/dL (ref 8.9–10.3)
CHLORIDE: 89 mmol/L — AB (ref 101–111)
CO2: 30 mmol/L (ref 22–32)
CREATININE: 1.25 mg/dL — AB (ref 0.44–1.00)
GFR calc Af Amer: 46 mL/min — ABNORMAL LOW (ref >60)
GFR calc non Af Amer: 40 mL/min — ABNORMAL LOW (ref >60)
Glucose, Bld: 100 mg/dL — ABNORMAL HIGH (ref 65–99)
Potassium: 4 mmol/L (ref 3.5–5.1)
SODIUM: 131 mmol/L — AB (ref 135–145)

## 2016-11-18 MED ORDER — IPRATROPIUM-ALBUTEROL 0.5-2.5 (3) MG/3ML IN SOLN
3.0000 mL | Freq: Two times a day (BID) | RESPIRATORY_TRACT | Status: DC
  Administered 2016-11-18 – 2016-11-25 (×14): 3 mL via RESPIRATORY_TRACT
  Filled 2016-11-18 (×14): qty 3

## 2016-11-18 MED ORDER — IPRATROPIUM-ALBUTEROL 0.5-2.5 (3) MG/3ML IN SOLN: 3.0000 mL | Freq: Four times a day (QID) | RESPIRATORY_TRACT | Status: DC | PRN

## 2016-11-18 NOTE — Progress Notes (Signed)
PROGRESS NOTE    Heather Miller  ZOX:[id number] DOB: [birth year] DOA: 11/11/2016 PCP: No PCP Per Patient    Brief Narrative: 81 yo with macular degeneration, blindness, hx of CHF, lives at home independently, admitted for CAP and CHF. She felt better with IV Lasix and IV Levaquin, along with oxygen. She has no complaints. Appreciate CM's help.  She will get HHA, RN, and PT if required.  Will see if she needs home oxygen as well. She is well at rest, but Desat on exertion with physical therapy.   Assessment & Plan:   Principal Problem:   Acute respiratory failure with hypoxia (HCC) Active Problems:   CAP (community acquired pneumonia)   Congestive heart failure (CHF) (HCC)   Hypokalemia   COPD exacerbation (HCC)  #1 acute respiratory failure with hypoxia  Supplemental O2 via nasal cannula - no evidence of increased work of breathing. Wean off oxygen as tolerated   #2 community acquired pneumonia  She's completed 5 days of Levaquin.    #3 acute diastolic congestive heart failure  Telemetry monitoring  Strict I/O  Daily Weights  Diuresis: She continues to have evidence of volume overload with pulmonary crackles and LE edema. We will treat with intravenous Lasix.  Place foley catheter for better Is and Os  ECHO showed EF 55 60% some inferobasal hypokinesis with grade 2 diastolic dysfx.   Repeat chest x-ray does not show significant change from admission  #4 hypokalemia  Replaced  DVT prophylaxis:Lovenox Consultants:None Code Status:Full code Family Communication:None Disposition Plan:Skilled nursing facility placement when stable.  Consultants:   None.   Procedures:   None.   Antimicrobials: Anti-infectives    Start     Dose/Rate Route Frequency Ordered Stop   11/12/16 1500  levofloxacin (LEVAQUIN) tablet 750 mg     750 mg Oral Every 24 hours 11/11/16 1910 11/16/16 1449   11/11/16 1500  levofloxacin (LEVAQUIN) IVPB 750 mg     750 mg 100 mL/hr  over 90 Minutes Intravenous  Once 11/11/16 1454 11/11/16 1710      Subjective:  Still becomes very short of breath with exertion. Overall feels breathing is slowly improving.   Objective: Vitals:   11/18/16 0500 11/18/16 0829 11/18/16 1314 11/18/16 1510  BP: 95/60  99/68   Pulse: (!) 102  (!) 102 (!) 106  Resp: 18  20   Temp: 97.8 F (36.6 C)  97.6 F (36.4 C)   TempSrc: Oral  Oral   SpO2: 97% 96% 94% 92%  Weight: 95.1 kg (209 lb 11.2 oz)     Height:        Intake/Output Summary (Last 24 hours) at 11/18/16 1835 Last data filed at 11/18/16 1300  Gross per 24 hour  Intake              240 ml  Output                0 ml  Net              240 ml   Filed Weights   11/16/16 0445 11/17/16 0455 11/18/16 0500  Weight: 94.1 kg (207 lb 8 oz) 98.6 kg (217 lb 4.8 oz) 95.1 kg (209 lb 11.2 oz)    Examination:  General exam: Appears calm and comfortable  Respiratory system: Bilateral crackles, mildly improving from yesterday.  Cardiovascular system: S1 & S2 heard, RRR. No JVD, murmurs, rubs, gallops or clicks. trace pedal edema. Gastrointestinal system: Abdomen is nondistended, soft and nontender. No  organomegaly or masses felt. Normal bowel sounds heard. Central nervous system: Alert and oriented. No focal neurological deficits. Extremities: Symmetric 5 x 5 power. Skin: No rashes, lesions or ulcers Psychiatry: Judgement and insight appear normal. Mood & affect appropriate.   Data Reviewed: I have personally reviewed following labs and imaging studies  CBC:  Recent Labs Lab 11/12/16 0627  WBC 9.5  HGB 11.2*  HCT 34.4*  MCV 83.7  PLT 417*   Basic Metabolic Panel:  Recent Labs Lab 11/14/16 0558 11/15/16 0428 11/16/16 0440 11/17/16 0607 11/18/16 0434  NA 135 133* 133* 132* 131*  K 4.8 5.0 5.1 4.5 4.0  CL 96* 95* 94* 92* 89*  CO2 29 27 28 29 30   GLUCOSE 108* 88 103* 108* 100*  BUN 20 20 22* 22* 22*  CREATININE 0.98 1.02* 1.14* 1.29* 1.25*  CALCIUM 9.1 9.0 9.2 9.1  9.0   GFR: Cardiac Enzymes: No results for input(s): CKTOTAL, CKMB, CKMBINDEX, TROPONINI in the last 168 hours.  Sepsis Labs:  Recent Labs Lab 11/11/16 1932 11/13/16 0644  PROCALCITON 1.57 1.10    Recent Results (from the past 240 hour(s))  Culture, blood (routine x 2) Call MD if unable to obtain prior to antibiotics being given     Status: None   Collection Time: 11/11/16  7:31 PM  Result Value Ref Range Status   Specimen Description BLOOD RIGHT FOREARM  Final   Special Requests BOTTLES DRAWN AEROBIC AND ANAEROBIC [facility] EACH  Final   Culture NO GROWTH 5 DAYS  Final   Report Status 11/16/2016 FINAL  Final  Culture, blood (routine x 2) Call MD if unable to obtain prior to antibiotics being given     Status: None   Collection Time: 11/11/16  7:32 PM  Result Value Ref Range Status   Specimen Description RIGHT ANTECUBITAL  Final   Special Requests BOTTLES DRAWN AEROBIC AND ANAEROBIC [facility] EACH  Final   Culture NO GROWTH 5 DAYS  Final   Report Status 11/16/2016 FINAL  Final     Radiology Studies: Dg Chest Port 1 View  Result Date: 11/17/2016 CLINICAL DATA:  Respiratory failure. EXAM: PORTABLE CHEST 1 VIEW COMPARISON:  11/11/2016. FINDINGS: Mediastinum is stable. Heart size stable. Persistent unchanged bilateral pulmonary infiltrates and/or edema. Low lung volumes. No prominent pleural effusion. No pneumothorax . Stable elevation left hemidiaphragm. Interposition of the colon left hemidiaphragm again noted. IMPRESSION: Persistent prominent bilateral pulmonary infiltrates and or edema. No significant change. Electronically Signed   By: Maisie Fushomas  Register   On: 11/17/2016 07:30    Scheduled Meds: . aspirin EC  81 mg Oral Daily  . enoxaparin (LOVENOX) injection  40 mg Subcutaneous Q24H  . furosemide  40 mg Intravenous BID  . guaiFENesin  1,200 mg Oral BID  . ipratropium-albuterol  3 mL Nebulization BID  . mouth rinse  15 mL Mouth Rinse BID  . sodium chloride flush  3 mL Intravenous Q12H    Continuous Infusions:   LOS: 6 days   MEMON,JEHANZEB, MD  Hospitalist.   If 7PM-7AM, please contact night-coverage [email or web address] Password [email or web address] 11/18/2016, 6:35 PM

## 2016-11-18 NOTE — Clinical Social Work Note (Addendum)
CSW updated [facility] on pt. Possible weekend d/c. Per [facility]NC, no beds available now. CSW left voicemail for pt's daughter requesting return call as pt has deferred decisions to her. Pt has other SNF bed offers.   Derenda FennelKara Sabre Romberger, LCSW [phone]

## 2016-11-18 NOTE — Progress Notes (Signed)
Physical Therapy Treatment Patient Details Name: Heather PrudeSara P Cermak MRN: [id number] DOB: 03/13/1936 Today's Date: 11/18/2016    History of Present Illness 81 y.o. female with a history of arthritis, blindness secondary to macular degeneration, kidney stones. Patient seen with 2 weeks of worsening shortness of breath, productive cough. Her symptoms have been worsening. Symptoms worse with ambulation and improved with rest. She was initially 80% on room air upon arrival patient received nebulizer treatment en route. Her symptoms improved, however the patient still desaturated with attempting to stand with orthostatic vital signs. She also endorses left-sided chest pain. She denies leg swelling, weight gain.   chest x-ray suspicious for pneumonia and congestive heart failure.    PT Comments    Pt received sitting up in her bed, rubbing her Rt knee. She was willing to participate in PT session, rating Rt knee pain and stiffness 7/10. She was able to perform all bed mobility with modified independence and transfers sit to stand requiring MinA. Sitting EOB, her SpO2 dropped to mid 80s, so it was unsafe to attempt ambulation. She was able to perform a stand pivot transfer without an AD and MinA, needing increased verbal cues for hand placement secondary to vision difficulties. Once in the bedside chair, she was instructed through pursed lip breathing until her SpO2 increased to 90%. She was left with her call bell and instructed to notify her nurse if needing to get up for any reason. She verbalized understanding. Current PT POC remains appropriate and we will continue to see this pt [facility]  Follow Up Recommendations  SNF     Equipment Recommendations  None recommended by PT    Recommendations for Other Services       Precautions / Restrictions Precautions Precautions: Fall Precaution Comments: due to immobility Restrictions Weight Bearing Restrictions: No    Mobility  Bed  Mobility Overal bed mobility: Modified Independent             General bed mobility comments: Once sitting on the EOB, pt desaturates to 85% with talking, and encouraged to perform deep breathing, and this improved back to 88-89% on 2.5L.    Transfers Overall transfer level: Needs assistance   Transfers: Sit to/from Stand Sit to Stand: Min assist Stand pivot transfers: Min assist       General transfer comment: Pt demonstrates desaturation to 82-84% on 2.5L during transfer bed to chair. Once in the chair, pts O2 remained anywhere from 85-89% and was instructed in more deep breating exercises. O2 eventually came back up to 90%. No reported symptoms of dizziness/light headedness  Ambulation/Gait Ambulation/Gait assistance:  (NA due to SpO2 desaturation. )               Stairs            Wheelchair Mobility    Modified Rankin (Stroke Patients Only)       Balance Overall balance assessment: Needs assistance Sitting-balance support: Feet supported;Bilateral upper extremity supported Sitting balance-Leahy Scale: Good     Standing balance support: Bilateral upper extremity supported Standing balance-Leahy Scale: Poor                      Cognition Arousal/Alertness: Awake/alert Behavior During Therapy: WFL for tasks assessed/performed Overall Cognitive Status: Within Functional Limits for tasks assessed                      Exercises      General Comments  Pertinent Vitals/Pain Pain Assessment: 0-10 Pain Score: 7  Pain Location: Rt knee Pain Descriptors / Indicators: Aching Pain Intervention(s): Limited activity within patient's tolerance;Monitored during session    Home Living                      Prior Function            PT Goals (current goals can now be found in the care plan section) Acute Rehab PT Goals Patient Stated Goal: None stated PT Goal Formulation: With patient Potential to Achieve Goals:  Fair Progress towards PT goals: Progressing toward goals    Frequency    Min 3X/week      PT Plan Current plan remains appropriate    Co-evaluation             End of Session Equipment Utilized During Treatment: Gait belt;Oxygen Activity Tolerance: Patient limited by fatigue Patient left: in chair;with call bell/phone within reach     Time: 1500-1530 PT Time Calculation (min) (ACUTE ONLY): 30 min  Charges:  $Therapeutic Activity: 8-22 mins $Self Care/Home Management: 8-22                    G Codes:      4:07 PM,Dec 18, 2016 Marylyn Ishihara PT, DPT [facility] Outpatient Physical Therapy [phone]

## 2016-11-19 LAB — BASIC METABOLIC PANEL
Anion gap: 13 (ref 5–15)
BUN: 23 mg/dL — AB (ref 6–20)
CHLORIDE: 88 mmol/L — AB (ref 101–111)
CO2: 29 mmol/L (ref 22–32)
CREATININE: 1.1 mg/dL — AB (ref 0.44–1.00)
Calcium: 9 mg/dL (ref 8.9–10.3)
GFR, EST AFRICAN AMERICAN: 54 mL/min — AB (ref >60)
GFR, EST NON AFRICAN AMERICAN: 47 mL/min — AB (ref >60)
Glucose, Bld: 98 mg/dL (ref 65–99)
Potassium: 3.7 mmol/L (ref 3.5–5.1)
SODIUM: 130 mmol/L — AB (ref 135–145)

## 2016-11-19 MED ORDER — MILK AND MOLASSES ENEMA
1.0000 | Freq: Once | RECTAL | Status: AC
  Administered 2016-11-19: 250 mL via RECTAL

## 2016-11-19 MED ORDER — FUROSEMIDE 10 MG/ML IJ SOLN
60.0000 mg | Freq: Two times a day (BID) | INTRAMUSCULAR | Status: DC
  Administered 2016-11-19 – 2016-11-20 (×2): 60 mg via INTRAVENOUS
  Filled 2016-11-19: qty 6

## 2016-11-19 MED ORDER — METHYLPREDNISOLONE SODIUM SUCC 125 MG IJ SOLR
60.0000 mg | Freq: Two times a day (BID) | INTRAMUSCULAR | Status: DC
  Administered 2016-11-19 – 2016-11-24 (×10): 60 mg via INTRAVENOUS
  Filled 2016-11-19 (×9): qty 2

## 2016-11-19 NOTE — Progress Notes (Signed)
PROGRESS NOTE    Heather PrudeSara P Fearn  VOZ:[id number] DOB: 06/28/1936 DOA: 11/11/2016 PCP: No PCP Per Patient    Brief Narrative: 81 yo with macular degeneration, blindness, hx of CHF, lives at home independently, admitted for CAP and CHF. She felt better with IV Lasix and IV Levaquin, along with oxygen. She has no complaints. Appreciate CM's help.  She will get HHA, RN, and PT if required.  Will see if she needs home oxygen as well. She is well at rest, but Desat on exertion with physical therapy.   Assessment & Plan:   Principal Problem:   Acute respiratory failure with hypoxia (HCC) Active Problems:   CAP (community acquired pneumonia)   Congestive heart failure (CHF) (HCC)   Hypokalemia   COPD exacerbation (HCC)  #1 acute respiratory failure with hypoxia  Supplemental O2 via nasal cannula - no evidence of increased work of breathing. Wean off oxygen as tolerated   #2 community acquired pneumonia  She's completed 5 days of Levaquin.   she continues to have crackles bilaterally and has occasional wheezing. She is receiving neb treatments and has completed a course of abx. Will give a short course of steroids. #3 acute diastolic congestive heart failure  Telemetry monitoring  Strict I/O  Daily Weights  Diuresis: She continues to have evidence of volume overload with pulmonary crackles and LE edema. Will increase dose of intravenous Lasix.  Place foley catheter for better Is and Os  ECHO showed EF 55 60% some inferobasal hypokinesis with grade 2 diastolic dysfx.   Repeat chest x-ray does not show significant change from admission  #4 hypokalemia  Replaced  DVT prophylaxis:Lovenox Consultants:None Code Status:Full code Family Communication:None Disposition Plan:Skilled nursing facility placement when stable.  Consultants:   None.   Procedures:   None.   Antimicrobials: Anti-infectives    Start     Dose/Rate Route Frequency Ordered Stop   11/12/16  1500  levofloxacin (LEVAQUIN) tablet 750 mg     750 mg Oral Every 24 hours 11/11/16 1910 11/16/16 1449   11/11/16 1500  levofloxacin (LEVAQUIN) IVPB 750 mg     750 mg 100 mL/hr over 90 Minutes Intravenous  Once 11/11/16 1454 11/11/16 1710      Subjective:  Has not had a bowel movement in several days. Feels that breathing is slowly improving.   Objective: Vitals:   11/18/16 2242 11/19/16 0545 11/19/16 0823 11/19/16 1535  BP: (!) 101/56 100/65  (!) 99/57  Pulse: (!) 107 96  (!) 101  Resp: 18 18  16   Temp: 97.5 F (36.4 C) 97.4 F (36.3 C)  97.8 F (36.6 C)  TempSrc: Oral Oral  Oral  SpO2: 99% 100% 94% 97%  Weight:  94.5 kg (208 lb 6.4 oz)    Height:        Intake/Output Summary (Last 24 hours) at 11/19/16 1556 Last data filed at 11/19/16 1020  Gross per 24 hour  Intake              123 ml  Output                0 ml  Net              123 ml   Filed Weights   11/17/16 0455 11/18/16 0500 11/19/16 0545  Weight: 98.6 kg (217 lb 4.8 oz) 95.1 kg (209 lb 11.2 oz) 94.5 kg (208 lb 6.4 oz)    Examination:  General exam: Appears calm and comfortable  Respiratory system:  Bilateral crackles.  Cardiovascular system: S1 & S2 heard, RRR. No JVD, murmurs, rubs, gallops or clicks. trace pedal edema. Gastrointestinal system: Abdomen is nondistended, soft and nontender. No organomegaly or masses felt. Normal bowel sounds heard. Central nervous system: Alert and oriented. No focal neurological deficits. Extremities: Symmetric 5 x 5 power. Skin: No rashes, lesions or ulcers Psychiatry: Judgement and insight appear normal. Mood & affect appropriate.   Data Reviewed: I have personally reviewed following labs and imaging studies  CBC: No results for input(s): WBC, NEUTROABS, HGB, HCT, MCV, PLT in the last 168 hours. Basic Metabolic Panel:  Recent Labs Lab 11/15/16 0428 11/16/16 0440 11/17/16 0607 11/18/16 0434 11/19/16 0710  NA 133* 133* 132* 131* 130*  K 5.0 5.1 4.5 4.0 3.7    CL 95* 94* 92* 89* 88*  CO2 27 28 29 30 29   GLUCOSE 88 103* 108* 100* 98  BUN 20 22* 22* 22* 23*  CREATININE 1.02* 1.14* 1.29* 1.25* 1.10*  CALCIUM 9.0 9.2 9.1 9.0 9.0   GFR: Cardiac Enzymes: No results for input(s): CKTOTAL, CKMB, CKMBINDEX, TROPONINI in the last 168 hours.  Sepsis Labs:  Recent Labs Lab 11/13/16 0644  PROCALCITON 1.10    Recent Results (from the past 240 hour(s))  Culture, blood (routine x 2) Call MD if unable to obtain prior to antibiotics being given     Status: None   Collection Time: 11/11/16  7:31 PM  Result Value Ref Range Status   Specimen Description BLOOD RIGHT FOREARM  Final   Special Requests BOTTLES DRAWN AEROBIC AND ANAEROBIC [facility] EACH  Final   Culture NO GROWTH 5 DAYS  Final   Report Status 11/16/2016 FINAL  Final  Culture, blood (routine x 2) Call MD if unable to obtain prior to antibiotics being given     Status: None   Collection Time: 11/11/16  7:32 PM  Result Value Ref Range Status   Specimen Description RIGHT ANTECUBITAL  Final   Special Requests BOTTLES DRAWN AEROBIC AND ANAEROBIC [facility] EACH  Final   Culture NO GROWTH 5 DAYS  Final   Report Status 11/16/2016 FINAL  Final     Radiology Studies: No results found.  Scheduled Meds: . aspirin EC  81 mg Oral Daily  . enoxaparin (LOVENOX) injection  40 mg Subcutaneous Q24H  . furosemide  60 mg Intravenous BID  . guaiFENesin  1,200 mg Oral BID  . ipratropium-albuterol  3 mL Nebulization BID  . mouth rinse  15 mL Mouth Rinse BID  . milk and molasses  1 enema Rectal Once  . sodium chloride flush  3 mL Intravenous Q12H   Continuous Infusions:   LOS: 7 days   Liahm Grivas, MD  Hospitalist.   If 7PM-7AM, please contact night-coverage [email or web address] Password [facility] 11/19/2016, 3:56 PM

## 2016-11-20 ENCOUNTER — Inpatient Hospital Stay (HOSPITAL_COMMUNITY): Payer: Medicare Other

## 2016-11-20 LAB — BASIC METABOLIC PANEL
Anion gap: 14 (ref 5–15)
BUN: 29 mg/dL — ABNORMAL HIGH (ref 6–20)
CALCIUM: 9.2 mg/dL (ref 8.9–10.3)
CO2: 28 mmol/L (ref 22–32)
CREATININE: 1.24 mg/dL — AB (ref 0.44–1.00)
Chloride: 88 mmol/L — ABNORMAL LOW (ref 101–111)
GFR, EST AFRICAN AMERICAN: 47 mL/min — AB (ref >60)
GFR, EST NON AFRICAN AMERICAN: 41 mL/min — AB (ref >60)
Glucose, Bld: 94 mg/dL (ref 65–99)
Potassium: 4 mmol/L (ref 3.5–5.1)
SODIUM: 130 mmol/L — AB (ref 135–145)

## 2016-11-20 LAB — CBC
HCT: 38.5 % (ref 36.0–46.0)
Hemoglobin: 12.4 g/dL (ref 12.0–15.0)
MCH: 26.2 pg (ref 26.0–34.0)
MCHC: 32.2 g/dL (ref 30.0–36.0)
MCV: 81.2 fL (ref 78.0–100.0)
PLATELETS: 387 10*3/uL (ref 150–400)
RBC: 4.74 MIL/uL (ref 3.87–5.11)
RDW: 14.1 % (ref 11.5–15.5)
WBC: 7.8 10*3/uL (ref 4.0–10.5)

## 2016-11-20 NOTE — Progress Notes (Signed)
PROGRESS NOTE    Heather Miller  ZOX:[id number] DOB: 06/21/1936 DOA: 11/11/2016 PCP: No PCP Per Patient    Brief Narrative: 81 yo with macular degeneration, blindness, hx of CHF, lives at home independently, admitted for CAP and CHF. She felt better with IV Lasix and IV Levaquin, along with oxygen. She has no complaints. Appreciate CM's help.  She will get HHA, RN, and PT if required.  Will see if she needs home oxygen as well. She is well at rest, but Desat on exertion with physical therapy.   Assessment & Plan:   Principal Problem:   Acute respiratory failure with hypoxia (HCC) Active Problems:   CAP (community acquired pneumonia)   Congestive heart failure (CHF) (HCC)   Hypokalemia   COPD exacerbation (HCC)  #1 acute respiratory failure with hypoxia  Supplemental O2 via nasal cannula - no evidence of increased work of breathing. Wean off oxygen as tolerated. She still becomes very short of breath and tachycardic on minimal exertion. Will repeat chest xray. If chest xray does not show new findings, may need to consider CT chest.   #2 community acquired pneumonia  She's completed 5 days of Levaquin.   she continues to have crackles bilaterally and has occasional wheezing. She is receiving neb treatments and has completed a course of abx. Will give a short course of steroids. #3 acute diastolic congestive heart failure  Telemetry monitoring  Strict I/O  Daily Weights  Diuresis: patient has been on IV lasix, but urine output has began to decline. Urine in foley bag is very concentrated. BUN trending up. Will hold off on further diuresis for now.  ECHO showed EF 55 60% some inferobasal hypokinesis with grade 2 diastolic dysfx.    #4 hypokalemia  Replaced  DVT prophylaxis:Lovenox Consultants:None Code Status:Full code Family Communication:None Disposition Plan:Skilled nursing facility placement when stable.  Consultants:   None.   Procedures:   None.    Antimicrobials: Anti-infectives    Start     Dose/Rate Route Frequency Ordered Stop   11/12/16 1500  levofloxacin (LEVAQUIN) tablet 750 mg     750 mg Oral Every 24 hours 11/11/16 1910 11/16/16 1449   11/11/16 1500  levofloxacin (LEVAQUIN) IVPB 750 mg     750 mg 100 mL/hr over 90 Minutes Intravenous  Once 11/11/16 1454 11/11/16 1710      Subjective:  Feels very weak and tired today. Feels that breathing is about the same as yesterday   Objective: Vitals:   11/19/16 2006 11/19/16 2106 11/20/16 0604 11/20/16 0816  BP: 92/64  98/63   Pulse: (!) 102  (!) 103   Resp: 18  18   Temp: 98.3 F (36.8 C)  98.1 F (36.7 C)   TempSrc: Oral  Oral   SpO2: 98% 98% 97% 93%  Weight:   94.2 kg (207 lb 9.6 oz)   Height:        Intake/Output Summary (Last 24 hours) at 11/20/16 1632 Last data filed at 11/20/16 0900  Gross per 24 hour  Intake              360 ml  Output              200 ml  Net              160 ml   Filed Weights   11/18/16 0500 11/19/16 0545 11/20/16 0604  Weight: 95.1 kg (209 lb 11.2 oz) 94.5 kg (208 lb 6.4 oz) 94.2 kg (207 lb 9.6  oz)    Examination:  General exam: Appears calm and comfortable  Respiratory system: Bilateral crackles.  Cardiovascular system: S1 & S2 heard, RRR. No JVD, murmurs, rubs, gallops or clicks. 1+ pedal edema. Gastrointestinal system: Abdomen is nondistended, soft and nontender. No organomegaly or masses felt. Normal bowel sounds heard. Central nervous system: Alert and oriented. No focal neurological deficits. Extremities: Symmetric 5 x 5 power. Skin: No rashes, lesions or ulcers Psychiatry: Judgement and insight appear normal. Mood & affect appropriate.   Data Reviewed: I have personally reviewed following labs and imaging studies  CBC:  Recent Labs Lab 11/20/16 0629  WBC 7.8  HGB 12.4  HCT 38.5  MCV 81.2  PLT 387   Basic Metabolic Panel:  Recent Labs Lab 11/16/16 0440 11/17/16 0607 11/18/16 0434 11/19/16 0710  11/20/16 0629  NA 133* 132* 131* 130* 130*  K 5.1 4.5 4.0 3.7 4.0  CL 94* 92* 89* 88* 88*  CO2 28 29 30 29 28   GLUCOSE 103* 108* 100* 98 94  BUN 22* 22* 22* 23* 29*  CREATININE 1.14* 1.29* 1.25* 1.10* 1.24*  CALCIUM 9.2 9.1 9.0 9.0 9.2   GFR: Cardiac Enzymes: No results for input(s): CKTOTAL, CKMB, CKMBINDEX, TROPONINI in the last 168 hours.  Sepsis Labs: No results for input(s): PROCALCITON, LATICACIDVEN in the last 168 hours.  Recent Results (from the past 240 hour(s))  Culture, blood (routine x 2) Call MD if unable to obtain prior to antibiotics being given     Status: None   Collection Time: 11/11/16  7:31 PM  Result Value Ref Range Status   Specimen Description BLOOD RIGHT FOREARM  Final   Special Requests BOTTLES DRAWN AEROBIC AND ANAEROBIC [facility] EACH  Final   Culture NO GROWTH 5 DAYS  Final   Report Status 11/16/2016 FINAL  Final  Culture, blood (routine x 2) Call MD if unable to obtain prior to antibiotics being given     Status: None   Collection Time: 11/11/16  7:32 PM  Result Value Ref Range Status   Specimen Description RIGHT ANTECUBITAL  Final   Special Requests BOTTLES DRAWN AEROBIC AND ANAEROBIC [facility] EACH  Final   Culture NO GROWTH 5 DAYS  Final   Report Status 11/16/2016 FINAL  Final     Radiology Studies: No results found.  Scheduled Meds: . aspirin EC  81 mg Oral Daily  . enoxaparin (LOVENOX) injection  40 mg Subcutaneous Q24H  . guaiFENesin  1,200 mg Oral BID  . ipratropium-albuterol  3 mL Nebulization BID  . mouth rinse  15 mL Mouth Rinse BID  . methylPREDNISolone (SOLU-MEDROL) injection  60 mg Intravenous Q12H  . sodium chloride flush  3 mL Intravenous Q12H   Continuous Infusions:   LOS: 8 days   MEMON,JEHANZEB, MD  Hospitalist.   If 7PM-7AM, please contact night-coverage [email or web address] Password [email or web address] 11/20/2016, 4:32 PM

## 2016-11-20 NOTE — Progress Notes (Signed)
Pt complaining of difficulty swallowing. Will notify MD and continue to monitor.

## 2016-11-21 ENCOUNTER — Inpatient Hospital Stay (HOSPITAL_COMMUNITY): Payer: Medicare Other

## 2016-11-21 LAB — BASIC METABOLIC PANEL
ANION GAP: 12 (ref 5–15)
BUN: 40 mg/dL — ABNORMAL HIGH (ref 6–20)
CALCIUM: 9.3 mg/dL (ref 8.9–10.3)
CO2: 29 mmol/L (ref 22–32)
Chloride: 87 mmol/L — ABNORMAL LOW (ref 101–111)
Creatinine, Ser: 1.33 mg/dL — ABNORMAL HIGH (ref 0.44–1.00)
GFR, EST AFRICAN AMERICAN: 43 mL/min — AB (ref >60)
GFR, EST NON AFRICAN AMERICAN: 37 mL/min — AB (ref >60)
GLUCOSE: 126 mg/dL — AB (ref 65–99)
POTASSIUM: 4.3 mmol/L (ref 3.5–5.1)
Sodium: 128 mmol/L — ABNORMAL LOW (ref 135–145)

## 2016-11-21 LAB — BRAIN NATRIURETIC PEPTIDE: B Natriuretic Peptide: 21 pg/mL (ref 0.0–100.0)

## 2016-11-21 MED ORDER — VANCOMYCIN HCL 1000 MG IV SOLR
INTRAVENOUS | Status: AC
  Filled 2016-11-21: qty 2000

## 2016-11-21 MED ORDER — SODIUM CHLORIDE 0.9 % IV SOLN
1250.0000 mg | INTRAVENOUS | Status: DC
  Administered 2016-11-22 – 2016-11-23 (×2): 1250 mg via INTRAVENOUS
  Filled 2016-11-21 (×3): qty 1250

## 2016-11-21 MED ORDER — VANCOMYCIN HCL 10 G IV SOLR
1750.0000 mg | Freq: Once | INTRAVENOUS | Status: AC
  Administered 2016-11-21: 1750 mg via INTRAVENOUS
  Filled 2016-11-21: qty 1750

## 2016-11-21 MED ORDER — DEXTROSE 5 % IV SOLN
INTRAVENOUS | Status: AC
  Filled 2016-11-21: qty 1

## 2016-11-21 MED ORDER — SODIUM CHLORIDE 0.9 % IV SOLN
INTRAVENOUS | Status: AC
  Administered 2016-11-21 – 2016-11-22 (×2): via INTRAVENOUS

## 2016-11-21 MED ORDER — DEXTROSE 5 % IV SOLN
1.0000 g | Freq: Three times a day (TID) | INTRAVENOUS | Status: DC
  Filled 2016-11-21 (×3): qty 1

## 2016-11-21 MED ORDER — DEXTROSE 5 % IV SOLN
1.0000 g | Freq: Two times a day (BID) | INTRAVENOUS | Status: DC
  Administered 2016-11-21 – 2016-11-22 (×2): 1 g via INTRAVENOUS
  Filled 2016-11-21 (×5): qty 1

## 2016-11-21 NOTE — Clinical Social Work Note (Signed)
CS[facility] report [facility] should have bed for pt now at d/c. CSW updated pt's daughter, Velna HatchetSheila who was very relieved. Will continue to follow.  Derenda FennelKara Capucine Tryon, LCSW [phone]

## 2016-11-21 NOTE — Care Management Note (Signed)
Case Management Note  Patient Details  Name: Heather GarrisonSarah P Miller MRN: [id number] Date of Birth: 01/16/1936  Expected Discharge Date:      11/24/2015            Expected Discharge Plan:  Skilled Nursing Facility  In-House Referral:  Clinical Social Work  Discharge planning Services  CM Consult  Status of Service:  Completed, signed off   Additional Comments: PT has recommended SNF. Pt agreeable. CSW aware and working with pt/family on DC planning. No further CM needs anticipated.   Malcolm Metrohildress, Ezio Wieck Demske, RN 11/21/2016, 3:41 PM

## 2016-11-21 NOTE — Progress Notes (Signed)
Pharmacy Antibiotic Note  Heather GarrisonSarah P Miller is a 81 y.o. female admitted on 11/11/2016 with pneumonia.  Pharmacy has been consulted for vancomycin dosing.  Plan: Vancomycin 1750 mg IV x 1 then 1250 mg IV q24 F/u renal function, cultures and clinical course  Height: 5\' 8"  (172.7 cm) Weight: 207 lb 9.6 oz (94.2 kg) IBW/kg (Calculated) : 63.9  Temp (24hrs), Avg:97.9 F (36.6 C), Min:97.6 F (36.4 C), Max:98.2 F (36.8 C)   Recent Labs Lab 11/17/16 0607 11/18/16 0434 11/19/16 0710 11/20/16 0629 11/21/16 0722  WBC  --   --   --  7.8  --   CREATININE 1.29* 1.25* 1.10* 1.24* 1.33*    Estimated Creatinine Clearance: 41.8 mL/min (by C-G formula based on SCr of 1.33 mg/dL (H)).    No Known Allergies  Antimicrobials this admission: levaquin 1/19  >> 1/24 Cefepime 1/29 vancomycin 1/29 >>   Microbiology results: 1/19  BCx: ng   Thank you for allowing pharmacy to be a part of this patient's care.  Talbert CageSeay, Amani Nodarse Poteet 11/21/2016 7:12 PM

## 2016-11-21 NOTE — Progress Notes (Signed)
PROGRESS NOTE    Heather Miller  XBJ:[id number] DOB: 03/29/1936 DOA: 11/11/2016 PCP: No PCP Per Patient    Brief Narrative: 81 yo with macular degeneration, blindness, hx of CHF, lives at home independently, admitted for CAP and CHF. She felt better with IV Lasix and IV Levaquin, along with oxygen. She has no complaints. Appreciate CM's help.  She will get HHA, RN, and PT if required.  Will see if she needs home oxygen as well. She is well at rest, but Desat on exertion with physical therapy.   Assessment & Plan:   Principal Problem:   Acute respiratory failure with hypoxia (HCC) Active Problems:   CAP (community acquired pneumonia)   Congestive heart failure (CHF) (HCC)   Hypokalemia   COPD exacerbation (HCC)  #1 acute respiratory failure with hypoxia  Supplemental O2 via nasal cannula - no evidence of increased work of breathing. Wean off oxygen as tolerated. She still becomes very short of breath and tachycardic on minimal exertion. Repeat chest xray shows slight increase in diffuse bilateral airspace disease. Will get CT chest. Check BNP   #2 community acquired pneumonia  She's completed 5 days of Levaquin.   she continues to have crackles bilaterally and has occasional wheezing. She is receiving neb treatments and has completed a course of abx. Will give a short course of steroids. #3 acute diastolic congestive heart failure  Telemetry monitoring  Strict I/O  Daily Weights  Diuresis: patient has been on IV lasix, but urine output has began to decline. Urine in foley bag is very concentrated. BUN trending up. Will hold off on further diuresis for now.  ECHO showed EF 55 60% some inferobasal hypokinesis with grade 2 diastolic dysfx.    #4 hypokalemia  Replaced  DVT prophylaxis:Lovenox Consultants:None Code Status:Full code Family Communication:None Disposition Plan:Skilled nursing facility placement when stable.  Consultants:   None.   Procedures:     None.   Antimicrobials: Anti-infectives    Start     Dose/Rate Route Frequency Ordered Stop   11/12/16 1500  levofloxacin (LEVAQUIN) tablet 750 mg     750 mg Oral Every 24 hours 11/11/16 1910 11/16/16 1449   11/11/16 1500  levofloxacin (LEVAQUIN) IVPB 750 mg     750 mg 100 mL/hr over 90 Minutes Intravenous  Once 11/11/16 1454 11/11/16 1710      Subjective:  Does not feel well today. Feels weak and tired.   Objective: Vitals:   11/20/16 2200 11/21/16 0631 11/21/16 0816 11/21/16 1400  BP: (!) 93/58 94/68  (!) 93/58  Pulse: 90 87  98  Resp: 18 18  18   Temp: 97.6 F (36.4 C) 97.9 F (36.6 C)  98.2 F (36.8 C)  TempSrc: Oral Oral  Oral  SpO2: 95% 98% (!) 88% 98%  Weight:      Height:        Intake/Output Summary (Last 24 hours) at 11/21/16 1517 Last data filed at 11/21/16 [id number]  Gross per 24 hour  Intake                3 ml  Output              500 ml  Net             -497 ml   Filed Weights   11/18/16 0500 11/19/16 0545 11/20/16 0604  Weight: 95.1 kg (209 lb 11.2 oz) 94.5 kg (208 lb 6.4 oz) 94.2 kg (207 lb 9.6 oz)    Examination:  General exam: Appears calm and comfortable  Respiratory system: Bilateral crackles, no wheezing  Cardiovascular system: S1 & S2 heard, RRR. No JVD, murmurs, rubs, gallops or clicks. trace pedal edema. Gastrointestinal system: Abdomen is nondistended, soft and nontender. No organomegaly or masses felt. Normal bowel sounds heard. Central nervous system: Alert and oriented. No focal neurological deficits. Extremities: Symmetric 5 x 5 power. Skin: No rashes, lesions or ulcers Psychiatry: Judgement and insight appear normal. Mood & affect appropriate.   Data Reviewed: I have personally reviewed following labs and imaging studies  CBC:  Recent Labs Lab 11/20/16 0629  WBC 7.8  HGB 12.4  HCT 38.5  MCV 81.2  PLT 387   Basic Metabolic Panel:  Recent Labs Lab 11/17/16 0607 11/18/16 0434 11/19/16 0710 11/20/16 0629  11/21/16 0722  NA 132* 131* 130* 130* 128*  K 4.5 4.0 3.7 4.0 4.3  CL 92* 89* 88* 88* 87*  CO2 29 30 29 28 29   GLUCOSE 108* 100* 98 94 126*  BUN 22* 22* 23* 29* 40*  CREATININE 1.29* 1.25* 1.10* 1.24* 1.33*  CALCIUM 9.1 9.0 9.0 9.2 9.3   GFR: Cardiac Enzymes: No results for input(s): CKTOTAL, CKMB, CKMBINDEX, TROPONINI in the last 168 hours.  Sepsis Labs: No results for input(s): PROCALCITON, LATICACIDVEN in the last 168 hours.  Recent Results (from the past 240 hour(s))  Culture, blood (routine x 2) Call MD if unable to obtain prior to antibiotics being given     Status: None   Collection Time: 11/11/16  7:31 PM  Result Value Ref Range Status   Specimen Description BLOOD RIGHT FOREARM  Final   Special Requests BOTTLES DRAWN AEROBIC AND ANAEROBIC [facility] EACH  Final   Culture NO GROWTH 5 DAYS  Final   Report Status 11/16/2016 FINAL  Final  Culture, blood (routine x 2) Call MD if unable to obtain prior to antibiotics being given     Status: None   Collection Time: 11/11/16  7:32 PM  Result Value Ref Range Status   Specimen Description RIGHT ANTECUBITAL  Final   Special Requests BOTTLES DRAWN AEROBIC AND ANAEROBIC [facility] EACH  Final   Culture NO GROWTH 5 DAYS  Final   Report Status 11/16/2016 FINAL  Final     Radiology Studies: Dg Chest Port 1 View  Result Date: 11/20/2016 CLINICAL DATA:  Shortness of breath for 3 weeks. EXAM: PORTABLE CHEST 1 VIEW COMPARISON:  11/17/2016 and prior exams FINDINGS: Diffuse bilateral airspace opacities are stable to slightly increased from the prior study. Cardiomediastinal silhouette is unchanged. There is no evidence of pneumothorax or definite pleural effusion. No acute bony abnormalities are noted. IMPRESSION: Stable to slight increase in diffuse bilateral airspace disease. Electronically Signed   By: Harmon Pier M.D.   On: 11/20/2016 17:19    Scheduled Meds: . aspirin EC  81 mg Oral Daily  . enoxaparin (LOVENOX) injection  40 mg Subcutaneous Q24H   . guaiFENesin  1,200 mg Oral BID  . ipratropium-albuterol  3 mL Nebulization BID  . mouth rinse  15 mL Mouth Rinse BID  . methylPREDNISolone (SOLU-MEDROL) injection  60 mg Intravenous Q12H  . sodium chloride flush  3 mL Intravenous Q12H   Continuous Infusions:   LOS: 9 days   Kayon Dozier, MD  Hospitalist.   If 7PM-7AM, please contact night-coverage [email or web address] Password [email or web address] 11/21/2016, 3:17 PM

## 2016-11-22 LAB — BASIC METABOLIC PANEL
Anion gap: 8 (ref 5–15)
BUN: 43 mg/dL — AB (ref 6–20)
CALCIUM: 8.9 mg/dL (ref 8.9–10.3)
CO2: 30 mmol/L (ref 22–32)
CREATININE: 1.23 mg/dL — AB (ref 0.44–1.00)
Chloride: 92 mmol/L — ABNORMAL LOW (ref 101–111)
GFR calc Af Amer: 47 mL/min — ABNORMAL LOW (ref >60)
GFR, EST NON AFRICAN AMERICAN: 41 mL/min — AB (ref >60)
Glucose, Bld: 114 mg/dL — ABNORMAL HIGH (ref 65–99)
Potassium: 4.5 mmol/L (ref 3.5–5.1)
SODIUM: 130 mmol/L — AB (ref 135–145)

## 2016-11-22 LAB — CBC
HCT: 36.8 % (ref 36.0–46.0)
Hemoglobin: 11.7 g/dL — ABNORMAL LOW (ref 12.0–15.0)
MCH: 25.7 pg — AB (ref 26.0–34.0)
MCHC: 31.8 g/dL (ref 30.0–36.0)
MCV: 80.7 fL (ref 78.0–100.0)
PLATELETS: 357 10*3/uL (ref 150–400)
RBC: 4.56 MIL/uL (ref 3.87–5.11)
RDW: 14.2 % (ref 11.5–15.5)
WBC: 10 10*3/uL (ref 4.0–10.5)

## 2016-11-22 MED ORDER — SODIUM CHLORIDE 0.9 % IV SOLN
INTRAVENOUS | Status: AC
  Administered 2016-11-22 – 2016-11-23 (×2): via INTRAVENOUS

## 2016-11-22 MED ORDER — DEXTROSE 5 % IV SOLN
2.0000 g | INTRAVENOUS | Status: DC
  Administered 2016-11-23 – 2016-11-24 (×2): 2 g via INTRAVENOUS
  Filled 2016-11-22 (×3): qty 2

## 2016-11-22 NOTE — Care Management Note (Signed)
Case Management Note  Patient Details  Name: Heather GarrisonSarah P Miller MRN: [id number] Date of Birth: 01/15/1936  If discussed at Long Length of Stay Meetings, dates discussed:  11/22/2016   Malcolm MetroChildress, Myleka Moncure Demske, RN 11/22/2016, 2:57 PM

## 2016-11-22 NOTE — Progress Notes (Signed)
Physical Therapy Treatment Patient Details Name: Heather Miller MRN: [id number] DOB: [birth year] Today's Date: 11/22/2016    History of Present Illness 81 y.o. female with a history of arthritis, blindness secondary to macular degeneration, kidney stones. Patient seen with 2 weeks of worsening shortness of breath, productive cough. Her symptoms have been worsening. Symptoms worse with ambulation and improved with rest. She was initially 80% on room air upon arrival patient received nebulizer treatment en route. Her symptoms improved, however the patient still desaturated with attempting to stand with orthostatic vital signs. She also endorses left-sided chest pain. She denies leg swelling, weight gain.   chest x-ray suspicious for pneumonia and congestive heart failure.    PT Comments    Pt supine in bed with family in room, willing to participate with therapy.  No reports of pain, does c/o SOB with standing.  Vitals monitored through session.  Pt improving bed mobility with no assistance required just prolonged time due to weakness.  Sat O2 at 97% when sitting on EOB.  Stand pivot transfer complete with RW and min A for safety with cueing for hand placement for safety.  Followoing transfer decrased SatO2 to 80%, diaphragmatic breathing instructed and pt able to return to SpO2 96% HR at 88 bmp.  No reports of pain through session.  Pt left in chair with call bell within reach and family members in room.      Follow Up Recommendations  SNF     Equipment Recommendations  None recommended by PT    Recommendations for Other Services       Precautions / Restrictions Precautions Precautions: Fall Precaution Comments: due to immobility Restrictions Weight Bearing Restrictions: No    Mobility  Bed Mobility Overal bed mobility: Independent             General bed mobility comments: No assistance needed with supine to sitting, Sat O2 at 97% prior transfer to chair.  Instucted deep  breathing techniques  Transfers Overall transfer level: Needs assistance Equipment used: Rolling walker (2 wheeled) Transfers: Sit to/from Stand Sit to Stand: Min assist Stand pivot transfers: Min assist       General transfer comment: Min A due to weakness for safety, able to follow appropriate commands with hand placement prior standing/sitting.  Decreased SatO2 to 80% following transfer, instructed deep breathing with increased to 96%.  No reports of dizziness of light headedness through session.    Ambulation/Gait                 Stairs            Wheelchair Mobility    Modified Rankin (Stroke Patients Only)       Balance                                    Cognition Arousal/Alertness: Awake/alert Behavior During Therapy: WFL for tasks assessed/performed Overall Cognitive Status: Within Functional Limits for tasks assessed                      Exercises      General Comments        Pertinent Vitals/Pain Pain Assessment: No/denies pain    Home Living                      Prior Function            PT  Goals (current goals can now be found in the care plan section)      Frequency    Min 3X/week      PT Plan Current plan remains appropriate    Co-evaluation             End of Session Equipment Utilized During Treatment: Gait belt;Oxygen Activity Tolerance: Patient limited by fatigue Patient left: in chair;with call bell/phone within reach     Time: 1340-1415 PT Time Calculation (min) (ACUTE ONLY): 35 min  Charges:  $Therapeutic Activity: 23-37 mins                    G Codes:     Becky SaxCasey Zhuri Krass, LPTA; CBIS [phone]  [name], Hanaa Payes Jo 11/22/2016, 4:56 PM

## 2016-11-22 NOTE — Progress Notes (Signed)
PROGRESS NOTE    JADAE STEINKE  ZOX:[id number] DOB: 07/24/1936 DOA: 11/11/2016 PCP: No PCP Per Patient    Brief Narrative: 81 yo with macular degeneration, blindness, hx of CHF, lives at home independently, admitted for CAP and CHF. She was initially treated with a 5 day course of Levaquin with some improvement. She was also treated with intravenous Lasix and significantly diuresed. Despite these measures, patient continues to be very short of breath on exertion became very tachycardic with heart rates in the 130s on exertion. She continues to have bilateral crackles . CT scan of the chest indicated persistent pneumonia. She is back on intravenous antibiotics.  Assessment & Plan:   Principal Problem:   Acute respiratory failure with hypoxia (HCC) Active Problems:   CAP (community acquired pneumonia)   Congestive heart failure (CHF) (HCC)   Hypokalemia   COPD exacerbation (HCC)  #1 acute respiratory failure with hypoxia  Supplemental O2 via nasal cannula - no evidence of increased work of breathing. Wean off oxygen as tolerated. She still becomes very short of breath and tachycardic on minimal exertion. BNP is currently normal. CT chest indicated persistent/worsening pneumonia.   #2 community acquired pneumonia  Initially had 5 days of Levaquin.   she continues to have crackles bilaterally and has occasional wheezing. She is currently on nebulizer treatments as well as a course of steroids. CT chest showed persistent/worsening pneumonia. She was placed back on IV antibiotics.. #3 acute diastolic congestive heart failure  Telemetry monitoring  Strict I/O  Daily Weights  Diuresis: patient was treated with IV lasix and had good diuresis. BNP is currently normal. Diuretics have been discontinued.  ECHO showed EF 55 60% some inferobasal hypokinesis with grade 2 diastolic dysfx.    #4 hypokalemia  Replaced  DVT prophylaxis:Lovenox Consultants:None Code Status:Full  code Family Communication:None Disposition Plan:Skilled nursing facility placement when stable.  Consultants:   None.   Procedures:  Echo: - Mild LVH with LVEF 55-60%. Probable basal inferior/inferolateral   hypokinesis noted. Grade 1 diastolic dysfunction. Mildly   calcified mitral and aortic annulus. Trivial mitral regurgitation    and tricuspid regurgitation. Unable to estimate PASP.  Antimicrobials: Anti-infectives    Start     Dose/Rate Route Frequency Ordered Stop   11/23/16 1000  ceFEPIme (MAXIPIME) 2 g in dextrose 5 % 50 mL IVPB     2 g 100 mL/hr over 30 Minutes Intravenous Every 24 hours 11/22/16 1239     11/22/16 1900  vancomycin (VANCOCIN) 1,250 mg in sodium chloride 0.9 % 250 mL IVPB     1,250 mg 166.7 mL/hr over 90 Minutes Intravenous Every 24 hours 11/21/16 1920     11/21/16 2000  ceFEPIme (MAXIPIME) 1 g in dextrose 5 % 50 mL IVPB  Status:  Discontinued     1 g 100 mL/hr over 30 Minutes Intravenous Every 8 hours 11/21/16 1903 11/21/16 1910   11/21/16 2000  ceFEPIme (MAXIPIME) 1 g in dextrose 5 % 50 mL IVPB  Status:  Discontinued     1 g 100 mL/hr over 30 Minutes Intravenous Every 12 hours 11/21/16 1910 11/22/16 1239   11/21/16 1915  vancomycin (VANCOCIN) 1,750 mg in sodium chloride 0.9 % 500 mL IVPB     1,750 mg 250 mL/hr over 120 Minutes Intravenous  Once 11/21/16 1912 11/21/16 2220   11/12/16 1500  levofloxacin (LEVAQUIN) tablet 750 mg     750 mg Oral Every 24 hours 11/11/16 1910 11/16/16 1449   11/11/16 1500  levofloxacin (LEVAQUIN) IVPB  750 mg     750 mg 100 mL/hr over 90 Minutes Intravenous  Once 11/11/16 1454 11/11/16 1710      Subjective:  Weakness a little better today. Still short of breath and coughing   Objective: Vitals:   11/22/16 0600 11/22/16 0803 11/22/16 1407 11/22/16 1624  BP: 92/61  (!) 90/59   Pulse: 80  63   Resp: 18  18   Temp: 97.3 F (36.3 C)  98.2 F (36.8 C)   TempSrc: Oral  Oral   SpO2: 100% 99%  97%  Weight:       Height:        Intake/Output Summary (Last 24 hours) at 11/22/16 1935 Last data filed at 11/22/16 1900  Gross per 24 hour  Intake          1601.25 ml  Output             1200 ml  Net           401.25 ml   Filed Weights   11/19/16 0545 11/20/16 0604 11/22/16 0333  Weight: 94.5 kg (208 lb 6.4 oz) 94.2 kg (207 lb 9.6 oz) 94.8 kg (208 lb 14.4 oz)    Examination:  General exam: Appears calm and comfortable  Respiratory system: Bilateral crackles, no wheezing  Cardiovascular system: S1 & S2 heard, RRR. No JVD, murmurs, rubs, gallops or clicks. trace pedal edema. Gastrointestinal system: Abdomen is nondistended, soft and nontender. No organomegaly or masses felt. Normal bowel sounds heard. Central nervous system: Alert and oriented. No focal neurological deficits. Extremities: Symmetric 5 x 5 power. Skin: No rashes, lesions or ulcers Psychiatry: Judgement and insight appear normal. Mood & affect appropriate.   Data Reviewed: I have personally reviewed following labs and imaging studies  CBC:  Recent Labs Lab 11/20/16 0629 11/22/16 0516  WBC 7.8 10.0  HGB 12.4 11.7*  HCT 38.5 36.8  MCV 81.2 80.7  PLT 387 357   Basic Metabolic Panel:  Recent Labs Lab 11/18/16 0434 11/19/16 0710 11/20/16 0629 11/21/16 0722 11/22/16 0516  NA 131* 130* 130* 128* 130*  K 4.0 3.7 4.0 4.3 4.5  CL 89* 88* 88* 87* 92*  CO2 30 29 28 29 30   GLUCOSE 100* 98 94 126* 114*  BUN 22* 23* 29* 40* 43*  CREATININE 1.25* 1.10* 1.24* 1.33* 1.23*  CALCIUM 9.0 9.0 9.2 9.3 8.9   GFR: Cardiac Enzymes: No results for input(s): CKTOTAL, CKMB, CKMBINDEX, TROPONINI in the last 168 hours.  Sepsis Labs: No results for input(s): PROCALCITON, LATICACIDVEN in the last 168 hours.  No results found for this or any previous visit (from the past 240 hour(s)).   Radiology Studies: Ct Chest Wo Contrast  Result Date: 11/21/2016 CLINICAL DATA:  81 year old female with shortness of breath for 3 weeks. Pneumonia.  Subsequent encounter. EXAM: CT CHEST WITHOUT CONTRAST TECHNIQUE: Multidetector CT imaging of the chest was performed following the standard protocol without IV contrast. COMPARISON:  11/20/2016 and 12/17/2011 chest x-ray. No comparison chest CT. FINDINGS: Cardiovascular: Heart minimally deformed by a elevated left hemidiaphragm. No aneurysmal dilation of the thoracic aorta. Scattered atherosclerotic changes. Slight prominence pulmonary arteries. Mediastinum/Nodes: Normal size lymph nodes. Small hiatal hernia. Lungs/Pleura: Bilateral parenchymal changes greatest in the lung bases and left upper lobe. Findings suspicious for infectious infiltrates superimposed upon chronic changes/ bronchiectasis. Trachea and mainstem bronchi are patent. Mild pulmonary vascular congestion. Elevated left hemidiaphragm. Upper Abdomen: Mild hyperplasia adrenal glands. Scarring inferior aspect left kidney. Musculoskeletal: No destructive lesion or compression  fracture. IMPRESSION: Bilateral parenchymal changes greatest in the lung bases and left upper lobe. Findings suspicious for infectious infiltrates superimposed upon chronic changes/ bronchiectasis. Mild pulmonary vascular congestion. Elevated left hemidiaphragm. Electronically Signed   By: Lacy Duverney M.D.   On: 11/21/2016 18:30    Scheduled Meds: . aspirin EC  81 mg Oral Daily  . [START ON 11/23/2016] ceFEPime (MAXIPIME) IV  2 g Intravenous Q24H  . enoxaparin (LOVENOX) injection  40 mg Subcutaneous Q24H  . guaiFENesin  1,200 mg Oral BID  . ipratropium-albuterol  3 mL Nebulization BID  . mouth rinse  15 mL Mouth Rinse BID  . methylPREDNISolone (SOLU-MEDROL) injection  60 mg Intravenous Q12H  . sodium chloride flush  3 mL Intravenous Q12H  . vancomycin  1,250 mg Intravenous Q24H   Continuous Infusions: . sodium chloride       LOS: 10 days   Dashaun Onstott, MD  Hospitalist.   If 7PM-7AM, please contact night-coverage [email or web address] Password [facility] 11/22/2016,  7:35 PM

## 2016-11-23 LAB — CBC
HCT: 35.1 % — ABNORMAL LOW (ref 36.0–46.0)
Hemoglobin: 11.1 g/dL — ABNORMAL LOW (ref 12.0–15.0)
MCH: 25.9 pg — ABNORMAL LOW (ref 26.0–34.0)
MCHC: 31.6 g/dL (ref 30.0–36.0)
MCV: 81.8 fL (ref 78.0–100.0)
Platelets: 325 10*3/uL (ref 150–400)
RBC: 4.29 MIL/uL (ref 3.87–5.11)
RDW: 14.2 % (ref 11.5–15.5)
WBC: 8.1 10*3/uL (ref 4.0–10.5)

## 2016-11-23 LAB — BASIC METABOLIC PANEL
ANION GAP: 6 (ref 5–15)
BUN: 29 mg/dL — ABNORMAL HIGH (ref 6–20)
CALCIUM: 8.6 mg/dL — AB (ref 8.9–10.3)
CO2: 29 mmol/L (ref 22–32)
Chloride: 98 mmol/L — ABNORMAL LOW (ref 101–111)
Creatinine, Ser: 0.94 mg/dL (ref 0.44–1.00)
GFR, EST NON AFRICAN AMERICAN: 57 mL/min — AB (ref >60)
Glucose, Bld: 113 mg/dL — ABNORMAL HIGH (ref 65–99)
Potassium: 4.1 mmol/L (ref 3.5–5.1)
SODIUM: 133 mmol/L — AB (ref 135–145)

## 2016-11-23 NOTE — Progress Notes (Signed)
PROGRESS NOTE    Heather Miller  KGM:[id number] DOB: [birth year] DOA: 11/11/2016 PCP: No PCP Per Patient    Brief Narrative:  54 ? macular degeneration,  Blindness,  hx of CHF,  lives at home independently,  admitted 11/11/16 c CAP and CHF.  She was initially treated with a 5 day course of Levaquin with some improvement. She was also treated with intravenous Lasix and significantly diuresed. Despite these measures, patient continues to be very short of breath on exertion became very tachycardic with heart rates in the 130s on exertion. She continues to have bilateral crackles . CT scan of the chest indicated persistent pneumonia. She is back on intravenous antibiotics.  Assessment & Plan:   Principal Problem:   Acute respiratory failure with hypoxia (HCC) Active Problems:   CAP (community acquired pneumonia)   Congestive heart failure (CHF) (HCC)   Hypokalemia   COPD exacerbation (HCC)  #1 acute respiratory failure with hypoxia  Supplemental O2 via nasal cannula - no evidence of increased work of breathing. Wean off oxygen as tolerated. She still becomes very short of breath and tachycardic on minimal exertion. BNP is currently normal. CT chest indicated persistent/worsening pneumonia-continue current abx for now.  Narrow off in the next 24 hours to oral equivalent #2 community acquired pneumonia  Initially had 5 days of Levaquin.   she continues to have crackles bilaterally and has occasional wheezing. She is currently on nebulizer treatments as well as a course of steroids. CT chest showed persistent/worsening pneumonia. She was placed back on IV antibiotics.. #3 acute diastolic congestive heart failure  Telemetry monitoring  Strict I/O  Daily Weights  Diuresis: patient was treated with IV lasix and had good diuresis. BNP is currently normal. Diuretics have been discontinued.  ECHO showed EF 55 60% some inferobasal hypokinesis with grade 2 diastolic dysfx.    #4  hypokalemi  Replaced  AKI From vol depeltion and diureses No further diuretics.  monitor  DVT prophylaxis:Lovenox Consultants:None Code Status:Full code Family Communication:None- Unable to contact Disposition Plan:Skilled nursing facility placement when stable.  Consultants:   None.   Procedures:  Echo: - Mild LVH with LVEF 55-60%. Probable basal inferior/inferolateral   hypokinesis noted. Grade 1 diastolic dysfunction. Mildly   calcified mitral and aortic annulus. Trivial mitral regurgitation    and tricuspid regurgitation. Unable to estimate PASP.  Antimicrobials: Anti-infectives    Start     Dose/Rate Route Frequency Ordered Stop   11/23/16 1000  ceFEPIme (MAXIPIME) 2 g in dextrose 5 % 50 mL IVPB     2 g 100 mL/hr over 30 Minutes Intravenous Every 24 hours 11/22/16 1239     11/22/16 1900  vancomycin (VANCOCIN) 1,250 mg in sodium chloride 0.9 % 250 mL IVPB     1,250 mg 166.7 mL/hr over 90 Minutes Intravenous Every 24 hours 11/21/16 1920     11/21/16 2000  ceFEPIme (MAXIPIME) 1 g in dextrose 5 % 50 mL IVPB  Status:  Discontinued     1 g 100 mL/hr over 30 Minutes Intravenous Every 8 hours 11/21/16 1903 11/21/16 1910   11/21/16 2000  ceFEPIme (MAXIPIME) 1 g in dextrose 5 % 50 mL IVPB  Status:  Discontinued     1 g 100 mL/hr over 30 Minutes Intravenous Every 12 hours 11/21/16 1910 11/22/16 1239   11/21/16 1915  vancomycin (VANCOCIN) 1,750 mg in sodium chloride 0.9 % 500 mL IVPB     1,750 mg 250 mL/hr over 120 Minutes Intravenous  Once 11/21/16 1912  11/21/16 2220   11/12/16 1500  levofloxacin (LEVAQUIN) tablet 750 mg     750 mg Oral Every 24 hours 11/11/16 1910 11/16/16 1449   11/11/16 1500  levofloxacin (LEVAQUIN) IVPB 750 mg     750 mg 100 mL/hr over 90 Minutes Intravenous  Once 11/11/16 1454 11/11/16 1710      Subjective:  Weakness a little better today. Still short of breath and coughing   Objective: Vitals:   11/22/16 2024 11/22/16 2237 11/23/16 0619  11/23/16 0754  BP:  94/61 95/60   Pulse:  82 78   Resp:  18 18   Temp:  97.7 F (36.5 C) 97.7 F (36.5 C)   TempSrc:  Oral Oral   SpO2: 97% 99% 97% 96%  Weight:   96.8 kg (213 lb 6.4 oz)   Height:        Intake/Output Summary (Last 24 hours) at 11/23/16 1241 Last data filed at 11/23/16 0900  Gross per 24 hour  Intake          1088.75 ml  Output              950 ml  Net           138.75 ml   Filed Weights   11/20/16 0604 11/22/16 0333 11/23/16 0619  Weight: 94.2 kg (207 lb 9.6 oz) 94.8 kg (208 lb 14.4 oz) 96.8 kg (213 lb 6.4 oz)    Examination:  General exam: Appears calm and comfortable  Respiratory system: Bilateral crackles, no wheezing  Cardiovascular system: S1 & S2 heard, RRR. No JVD, murmurs, rubs, gallops or clicks. trace pedal edema. Gastrointestinal system: Abdomen is nondistended, soft and nontender. No organomegaly or masses felt. Normal bowel sounds heard. Central nervous system: Alert and oriented. No focal neurological deficits. Extremities: Symmetric 5 x 5 power. Skin: No rashes, lesions or ulcers Psychiatry: Judgement and insight appear normal. Mood & affect appropriate.   Data Reviewed: I have personally reviewed following labs and imaging studies  CBC:  Recent Labs Lab 11/20/16 0629 11/22/16 0516 11/23/16 0603  WBC 7.8 10.0 8.1  HGB 12.4 11.7* 11.1*  HCT 38.5 36.8 35.1*  MCV 81.2 80.7 81.8  PLT 387 357 325   Basic Metabolic Panel:  Recent Labs Lab 11/19/16 0710 11/20/16 0629 11/21/16 0722 11/22/16 0516 11/23/16 0603  NA 130* 130* 128* 130* 133*  K 3.7 4.0 4.3 4.5 4.1  CL 88* 88* 87* 92* 98*  CO2 29 28 29 30 29   GLUCOSE 98 94 126* 114* 113*  BUN 23* 29* 40* 43* 29*  CREATININE 1.10* 1.24* 1.33* 1.23* 0.94  CALCIUM 9.0 9.2 9.3 8.9 8.6*   GFR: Cardiac Enzymes: No results for input(s): CKTOTAL, CKMB, CKMBINDEX, TROPONINI in the last 168 hours.  Sepsis Labs: No results for input(s): PROCALCITON, LATICACIDVEN in the last 168  hours.  No results found for this or any previous visit (from the past 240 hour(s)).   Radiology Studies: Ct Chest Wo Contrast  Result Date: 11/21/2016 CLINICAL DATA:  81 year old female with shortness of breath for 3 weeks. Pneumonia. Subsequent encounter. EXAM: CT CHEST WITHOUT CONTRAST TECHNIQUE: Multidetector CT imaging of the chest was performed following the standard protocol without IV contrast. COMPARISON:  11/20/2016 and 12/17/2011 chest x-ray. No comparison chest CT. FINDINGS: Cardiovascular: Heart minimally deformed by a elevated left hemidiaphragm. No aneurysmal dilation of the thoracic aorta. Scattered atherosclerotic changes. Slight prominence pulmonary arteries. Mediastinum/Nodes: Normal size lymph nodes. Small hiatal hernia. Lungs/Pleura: Bilateral parenchymal changes greatest in the  lung bases and left upper lobe. Findings suspicious for infectious infiltrates superimposed upon chronic changes/ bronchiectasis. Trachea and mainstem bronchi are patent. Mild pulmonary vascular congestion. Elevated left hemidiaphragm. Upper Abdomen: Mild hyperplasia adrenal glands. Scarring inferior aspect left kidney. Musculoskeletal: No destructive lesion or compression fracture. IMPRESSION: Bilateral parenchymal changes greatest in the lung bases and left upper lobe. Findings suspicious for infectious infiltrates superimposed upon chronic changes/ bronchiectasis. Mild pulmonary vascular congestion. Elevated left hemidiaphragm. Electronically Signed   By: Lacy Duverney M.D.   On: 11/21/2016 18:30    Scheduled Meds: . aspirin EC  81 mg Oral Daily  . ceFEPime (MAXIPIME) IV  2 g Intravenous Q24H  . enoxaparin (LOVENOX) injection  40 mg Subcutaneous Q24H  . guaiFENesin  1,200 mg Oral BID  . ipratropium-albuterol  3 mL Nebulization BID  . mouth rinse  15 mL Mouth Rinse BID  . methylPREDNISolone (SOLU-MEDROL) injection  60 mg Intravenous Q12H  . sodium chloride flush  3 mL Intravenous Q12H  . vancomycin   1,250 mg Intravenous Q24H   Continuous Infusions: . sodium chloride 75 mL/hr at 11/23/16 1239     LOS: 11 days   Rhetta Mura, MD  Hospitalist.   If 7PM-7AM, please contact night-coverage [email or web address] Password [facility] 11/23/2016, 12:41 PM

## 2016-11-24 MED ORDER — PREDNISONE 20 MG PO TABS
40.0000 mg | ORAL_TABLET | Freq: Every day | ORAL | Status: DC
  Administered 2016-11-25: 40 mg via ORAL
  Filled 2016-11-24: qty 2

## 2016-11-24 MED ORDER — LEVOFLOXACIN 500 MG PO TABS
500.0000 mg | ORAL_TABLET | Freq: Every day | ORAL | Status: DC
  Administered 2016-11-24 – 2016-11-25 (×2): 500 mg via ORAL
  Filled 2016-11-24 (×2): qty 1

## 2016-11-24 MED ORDER — ENSURE ENLIVE PO LIQD
237.0000 mL | Freq: Two times a day (BID) | ORAL | Status: DC
  Administered 2016-11-24 – 2016-11-25 (×2): 237 mL via ORAL

## 2016-11-24 NOTE — Progress Notes (Signed)
Initial Nutrition Assessment  DOCUMENTATION CODES:  Obesity unspecified  INTERVENTION:  Ensure Enlive po BID, each supplement provides 350 kcal and 20 grams of protein  Appreciate dietary's intentness to patient.   NUTRITION DIAGNOSIS:  Inadequate oral intake related to poor appetite, acute illness, Fatigue as evidenced by meal completion < 50%.  GOAL:  Patient will meet greater than or equal to 90% of their needs  MONITOR:  PO intake, Supplement acceptance, Labs, Weight trends  REASON FOR ASSESSMENT:  LOS    ASSESSMENT:  81 y/o female PMhx arthritis, kidney stones, blindness. Originally presented with 2 weeks of worsening SOB. Worked up for PNA and CHF.   Pt has been admitted for nearly 2 weeks. Discharge complicated by slow to resolve PNA.   PO intake throughout hospitalization has been poor. She has consistently consumed <50% of her meals up until the past couple days. Has had numerous meal intakes of <25%. The past couple days, she does appear to be eating slightly more.   Today, pt says that her appetite is "gradually returning". She denies N/V/C/D and says that really her SOB and poor appetite are her only symptoms.   She says there has been no issues with the food; she just isnt hungry. However, she does note many of the meats are too tough to chew, but says dietary has been very good with helping her find something she can eat.   She believes that her weight just prior to admission was 214 lbs. Her weights fluctuate given her CHF. Weight this admission have ranged from 207.5 to 214.5 lbs.   She has not been receiving supplements, but is willing to try them. She did not have any specific meal requests, other than the food being softer  NFPE: Obese, edema  Labs: Hyponatremia improving, BG 110-130 mg/dl Medications: ABx, Deltasone   Recent Labs Lab 11/21/16 0722 11/22/16 0516 11/23/16 0603  NA 128* 130* 133*  K 4.3 4.5 4.1  CL 87* 92* 98*  CO2 29 30 29   BUN  40* 43* 29*  CREATININE 1.33* 1.23* 0.94  CALCIUM 9.3 8.9 8.6*  GLUCOSE 126* 114* 113*   Diet Order:  Diet Heart Room service appropriate? Yes; Fluid consistency: Thin  Skin:  Reviewed, no issues  Last BM:  1/30  Height:  Ht Readings from Last 1 Encounters:  11/11/16 5\' 8"  (1.727 m)   Weight:  Wt Readings from Last 1 Encounters:  11/23/16 213 lb 6.4 oz (96.8 kg)   Wt Readings from Last 10 Encounters:  11/23/16 213 lb 6.4 oz (96.8 kg)  12/17/11 242 lb (109.8 kg)  05/06/11 242 lb (109.8 kg)   Ideal Body Weight:  63.64 kg  BMI:  Body mass index is 32.45 kg/m.  Estimated Nutritional Needs:  Kcal:  1750-1950 (18-20 kcal/kg bw) Protein:  85-95 g Pro (1.3-1.5 g/kg bw) Fluid:  1.8-2 Liters (461ml/kcal)  EDUCATION NEEDS:  No education needs identified at this time  Heather Miller RD, LDN, CNSC Clinical Nutrition Pager: [phone] 11/24/2016 1:45 PM

## 2016-11-24 NOTE — Progress Notes (Signed)
Physical Therapy Treatment Patient Details Name: Heather Miller MRN: [id number] DOB: 1936/06/02 Today's Date: 11/24/2016    History of Present Illness 81 y.o. female with a history of arthritis, blindness secondary to macular degeneration, kidney stones. Patient seen with 2 weeks of worsening shortness of breath, productive cough. Her symptoms have been worsening. Symptoms worse with ambulation and improved with rest. She was initially 80% on room air upon arrival patient received nebulizer treatment en route. Her symptoms improved, however the patient still desaturated with attempting to stand with orthostatic vital signs. She also endorses left-sided chest pain. She denies leg swelling, weight gain.   chest x-ray suspicious for pneumonia and congestive heart failure.    PT Comments    Pt supine in bed upon PTA entrance and agreeable to participate.  Noted O2A removed, spoke with RN concerning.  Pt improving mobility with independent supine to sit.  SpO2 at 90% supine with just room air.  Following bed mobility supine to sit O2 reduced to 86%.  Resumed O2A with improved to 96% prior SPT to chair.  Min guard/A for safety with transfer with verbal and tactile cueing for spatial awareness due to vision deficit.  Significant reduction in SpO2 to 78% with 2.5L O2A.  Pt able to regain SpO2 to 94% less than 30 seconds, reduced O2 A to 2L.  RN aware of pt status as well as dependence of O2A.  No reports of pain through session.   Follow Up Recommendations  SNF     Equipment Recommendations  None recommended by PT    Recommendations for Other Services       Precautions / Restrictions Precautions Precautions: Fall Precaution Comments: due to immobility    Mobility  Bed Mobility Overal bed mobility: Independent             General bed mobility comments: No assistance need with supine to sitting.  No O2 A when entered, spoke with RN.  SpO2 at 90% supine.    Transfers Overall transfer level:  Needs assistance Equipment used: Rolling walker (2 wheeled) Transfers: Sit to/from Stand Sit to Stand: Min assist         General transfer comment: SpO2 reduced to 86% following supine to sit, resumed O2A and increased to 2.5L of O2 prior transfer.  Pt improving mobiltiy with min guard for safety due to weakness.  Pt able to follow commands with tactile cueing for space awareness prior standing pivot transfer.  SpO2 reduced to 78% with 2.5L O2A.  no reoprts of dizziness or light headedness.  Instructed deep breathing techqniues with SpO2 increased to 94%, decreased to 2L O2A at EOS.    Ambulation/Gait                 Stairs            Wheelchair Mobility    Modified Rankin (Stroke Patients Only)       Balance                                    Cognition Arousal/Alertness: Awake/alert Behavior During Therapy: WFL for tasks assessed/performed Overall Cognitive Status: Within Functional Limits for tasks assessed                      Exercises      General Comments        Pertinent Vitals/Pain Pain Assessment: No/denies pain  Home Living                      Prior Function            PT Goals (current goals can now be found in the care plan section)      Frequency    Min 3X/week      PT Plan Current plan remains appropriate    Co-evaluation             End of Session Equipment Utilized During Treatment: Gait belt;Oxygen Activity Tolerance: Patient limited by fatigue Patient left: in chair;with call bell/phone within reach     Time: [id number] PT Time Calculation (min) (ACUTE ONLY): 27 min  Charges:  $Therapeutic Activity: 8-22 mins                    G Codes:     [name], LPTA; CBIS [phone]  [name], [name] 11/24/2016, 1:13 PM

## 2016-11-24 NOTE — Progress Notes (Signed)
PROGRESS NOTE    Heather Miller  ZOX:[id number] DOB: 05/31/1936 DOA: 11/11/2016 PCP: No PCP Per Patient    Brief Narrative:   [age] ? macular degeneration,  Blindness,  hx of CHF,  lives at home independently,  admitted 11/11/16 c CAP and CHF.  She was initially treated with a 5 day course of Levaquin with some improvement. She was also treated with intravenous Lasix and significantly diuresed. Despite these measures, patient continues to be very short of breath on exertion became very tachycardic with heart rates in the 130s on exertion. She continues to have bilateral crackles . CT scan of the chest indicated persistent pneumonia. She is back on intravenous antibiotics.  Assessment & Plan:   Principal Problem:   Acute respiratory failure with hypoxia (HCC) Active Problems:   CAP (community acquired pneumonia)   Congestive heart failure (CHF) (HCC)   Hypokalemia   COPD exacerbation (HCC)  #1 acute respiratory failure with hypoxia  Supplemental O2 via nasal cannula - no evidence of increased work of breathing. Wean off oxygen as tolerated. She still becomes very short of breath and tachycardic on minimal exertion. BNP is currently normal. CT chest indicated persistent/worsening pneumonia-continue current abx for now.  Narrow off to levaquin 2/1 and observe-if no recurrence or worsening can probably d/c #2 community acquired pneumonia  Initially had 5 days of Levaquin.   she continues to have crackles bilaterally and has occasional wheezing. She is currently on nebulizer treatments as well as a course of steroids. CT chest showed persistent/worsening pneumonia. She was placed back on IV antibiotics.. #3 acute diastolic congestive heart failure  Telemetry monitoring  Strict I/O  Daily Weights  Diuresis: patient was treated with IV lasix and had good diuresis. BNP is currently normal. Diuretics have been discontinued.  ECHO showed EF 55 60% some inferobasal hypokinesis with  grade 2 diastolic dysfx.    #4 hypokalemi  Replaced  AKI From vol depeltion and diureses No further diuretics.  Monitor Resolving well on lab testing  DVT prophylaxis:Lovenox Consultants:None Code Status:Full code Family Communication:spoke with daugther at bedsdie Disposition Plan:Skilled nursing facility placement when stable.  Consultants:   None.   Procedures:  Echo: - Mild LVH with LVEF 55-60%. Probable basal inferior/inferolateral   hypokinesis noted. Grade 1 diastolic dysfunction. Mildly   calcified mitral and aortic annulus. Trivial mitral regurgitation    and tricuspid regurgitation. Unable to estimate PASP.  Antimicrobials:  Subjective:   Overall better  no sob or cp No wheeze No n/v eating fairly well   Objective: Vitals:   11/23/16 2041 11/23/16 2200 11/24/16 0655 11/24/16 0933  BP:  121/65 98/64   Pulse:  (!) 109 92   Resp:  20 20   Temp:  97.3 F (36.3 C) 97.6 F (36.4 C)   TempSrc:  Oral Oral   SpO2: 94% 100% 98% 98%  Weight:      Height:        Intake/Output Summary (Last 24 hours) at 11/24/16 0958 Last data filed at 11/23/16 1900  Gross per 24 hour  Intake             1530 ml  Output                0 ml  Net             1530 ml   Filed Weights   11/20/16 0604 11/22/16 0333 11/23/16 0619  Weight: 94.2 kg (207 lb 9.6 oz) 94.8 kg (208 lb 14.4  oz) 96.8 kg (213 lb 6.4 oz)    Examination:  General exam: Appears calm and comfortable -poor vision Respiratory system: decreased crackles, no wheezing  Cardiovascular system: S1 & S2 heard, RRR. No JVD, murmurs, rubs, gallops or clicks. trace pedal edema. Gastrointestinal system: Abdomen is nondistended, soft and nontender. No organomegaly or masses felt. Normal bowel sounds heard. Central nervous system: Alert and oriented. No focal neurological deficits. Extremities: Symmetric 5 x 5 power. Skin: No rashes, lesions or ulcers Psychiatry: Judgement and insight appear normal. Mood &  affect appropriate.   Data Reviewed: I have personally reviewed following labs and imaging studies  CBC:  Recent Labs Lab 11/20/16 0629 11/22/16 0516 11/23/16 0603  WBC 7.8 10.0 8.1  HGB 12.4 11.7* 11.1*  HCT 38.5 36.8 35.1*  MCV 81.2 80.7 81.8  PLT 387 357 325   Basic Metabolic Panel:  Recent Labs Lab 11/19/16 0710 11/20/16 0629 11/21/16 0722 11/22/16 0516 11/23/16 0603  NA 130* 130* 128* 130* 133*  K 3.7 4.0 4.3 4.5 4.1  CL 88* 88* 87* 92* 98*  CO2 29 28 29 30 29   GLUCOSE 98 94 126* 114* 113*  BUN 23* 29* 40* 43* 29*  CREATININE 1.10* 1.24* 1.33* 1.23* 0.94  CALCIUM 9.0 9.2 9.3 8.9 8.6*   GFR: Cardiac Enzymes: No results for input(s): CKTOTAL, CKMB, CKMBINDEX, TROPONINI in the last 168 hours.  Sepsis Labs: No results for input(s): PROCALCITON, LATICACIDVEN in the last 168 hours.  No results found for this or any previous visit (from the past 240 hour(s)).   Radiology Studies: No results found.  Scheduled Meds: . aspirin EC  81 mg Oral Daily  . ceFEPime (MAXIPIME) IV  2 g Intravenous Q24H  . enoxaparin (LOVENOX) injection  40 mg Subcutaneous Q24H  . guaiFENesin  1,200 mg Oral BID  . ipratropium-albuterol  3 mL Nebulization BID  . mouth rinse  15 mL Mouth Rinse BID  . methylPREDNISolone (SOLU-MEDROL) injection  60 mg Intravenous Q12H  . sodium chloride flush  3 mL Intravenous Q12H  . vancomycin  1,250 mg Intravenous Q24H   Continuous Infusions:    LOS: 12 days   25 min  Pleas Koch, MD [facility] ([phone]   If 7PM-7AM, please contact night-coverage [email or web address] Password [email or web address] 11/24/2016, 9:58 AM

## 2016-11-25 MED ORDER — PREDNISONE 20 MG PO TABS: 40.0000 mg | ORAL_TABLET | Freq: Every day | ORAL | 0 refills | Status: DC

## 2016-11-25 MED ORDER — LEVOFLOXACIN 500 MG PO TABS: 500.0000 mg | ORAL_TABLET | Freq: Every day | ORAL | 0 refills | Status: DC

## 2016-11-25 NOTE — Clinical Social Work Placement (Addendum)
   CLINICAL SOCIAL WORK PLACEMENT  NOTE  Date:  11/25/2016  Patient Details  Name: Heather Miller MRN: [id number] Date of Birth: 11/24/1935  Clinical Social Work is seeking post-discharge placement for this patient at the Skilled  Nursing Facility level of care (*CSW will initial, date and re-position this form in  chart as items are completed):  Yes   Patient/family provided with [place] Clinical Social Work Department's list of facilities offering this level of care within the geographic area requested by the patient (or if unable, by the patient's family).  Yes   Patient/family informed of their freedom to choose among providers that offer the needed level of care, that participate in Medicare, Medicaid or managed care program needed by the patient, have an available bed and are willing to accept the patient.  Yes   Patient/family informed of [facility]'s ownership interest in [facility] and [facility], as well as of the fact that they are under no obligation to receive care at these facilities.  PASRR submitted to EDS on 11/15/16     PASRR number received on 11/15/16     Existing PASRR number confirmed on 11/15/16     FL2 transmitted to all facilities in geographic area requested by pt/family on       FL2 transmitted to all facilities within larger geographic area on       Patient informed that his/her managed care company has contracts with or will negotiate with certain facilities, including the following:        Yes   Patient/family informed of bed offers received.  Patient [facility] bed [facility]     Physician recommends and patient chooses bed at      Patient to be [facility] to [facility] on 11/25/16.  Patient to be transferred to facility by staff     Patient family notified on 11/25/16 of transfer.  Name of family member notified:  Velna HatchetSheila- daughter  (by voicemail)   PHYSICIAN Please sign FL2     Additional Comment:     _______________________________________________ Karn CassisStultz, Chauncey Sciulli Shanaberger, LCSW 11/25/2016, 12:04 PM [phone]

## 2016-11-25 NOTE — Progress Notes (Signed)
Discharged to [facility] with oxygen via w/c in stable condition.

## 2016-11-25 NOTE — Care Management Important Message (Signed)
Important Message  Patient Details  Name: Heather GarrisonSarah P Kanan MRN: [id number] Date of Birth: 06/14/1936   Medicare Important Message Given:  Yes    Malcolm MetroChildress, Khyla Mccumbers Demske, RN 11/25/2016, 1:16 PM

## 2016-11-25 NOTE — Discharge Summary (Signed)
Physician Discharge Summary  Heather GarrisonSarah P Miller [id number] DOB: 06/14/1936 DOA: 11/11/2016  PCP: No PCP Per Patient  Admit date: 11/11/2016 Discharge date: 11/25/2016  Time spent: 35 minutes  Recommendations for Outpatient Follow-up:  1. Recommend Levaquin through 11/30/2016 for complicated pneumonia 2. Continue prednisone until 11/30/2016 as well 3. Needs rpt CXR in about 4 weeks to document clearing of the infection 4. Recommend monitoring fluid balance 5. Will need 2 liters oxygen on d/c which will need to be down-titrated  Discharge Diagnoses:  Principal Problem:   Acute respiratory failure with hypoxia (HCC) Active Problems:   CAP (community acquired pneumonia)   Congestive heart failure (CHF) (HCC)   Hypokalemia   COPD exacerbation (HCC)   Discharge Condition: well  Diet recommendation: hh low salt  Filed Weights   11/22/16 0333 11/23/16 0619 11/25/16 0500  Weight: 94.8 kg (208 lb 14.4 oz) 96.8 kg (213 lb 6.4 oz) 96.6 kg (212 lb 15.4 oz)    History of present illness:  [age] ? macular degeneration,  Blindness,  hx of CHF,  lives at home independently,  admitted 11/11/16 c CAP and CHF.  She was initially treated with a 5 day course of Levaquin with some improvement. She was also treated with intravenous Lasix and significantly diuresed. Despite these measures, patient continues to be very short of breath on exertion became very tachycardic with heart rates in the 130s on exertion. She continues to have bilateral crackles . CT scan of the chest indicated persistent pneumonia. She is back on intravenous antibiotics.  Hospital Course:  Multifactorial acute respiratory failure with hypoxia Pneumonia + heart failure  Supplemental O2 via nasal cannula - no evidence of increased work of breathing. Wean off oxygen as tolerated at facility. Improve don d/c CT chest indicated persistent/worsening pneumonia-placed back on IV and finally did  Narrow off to levaquin 2/1 --course to  complete 11/30/16 #2 community acquired pneumonia  Initially had 5 days of Levaquin.   See above discussion #3 acute diastolic congestive heart failure  Telemetry monitoring  Strict I/O  Daily Weights  Diuresis: patient was treated with IV lasix and had good diuresis. BNP is currently normal. Diuretics have been discontinued.  ECHO showed EF 55 60% some inferobasal hypokinesis with grade 2 diastolic dysfx.    #4 hypokalemia  Replaced   #5 AKI-probably from infection and diuresis BUN creat peaked at 40/1.33 and went to down to 29/0.94 on d/c  Discharge Exam: Vitals:   11/24/16 2136 11/25/16 0500  BP: 104/61 100/61  Pulse: 87 100  Resp: 20 20  Temp: 98 F (36.7 C) 98.2 F (36.8 C)    General: alert pleasant oriented and in NAD Cardiovascular: s1 s 2no m/r/g Respiratory: no rales no rhonchi  Discharge Instructions    Current Discharge Medication List    CONTINUE these medications which have NOT CHANGED   Details  aspirin EC 81 MG tablet Take 81 mg by mouth daily.    ibuprofen (ADVIL,MOTRIN) 200 MG tablet Take 200 mg by mouth every 8 (eight) hours as needed for mild pain.       No Known Allergies    The results of significant diagnostics from this hospitalization (including imaging, microbiology, ancillary and laboratory) are listed below for reference.    Significant Diagnostic Studies: Ct Chest Wo Contrast  Result Date: 11/21/2016 CLINICAL DATA:  81 year old female with shortness of breath for 3 weeks. Pneumonia. Subsequent encounter. EXAM: CT CHEST WITHOUT CONTRAST TECHNIQUE: Multidetector CT imaging of the chest was performed following  the standard protocol without IV contrast. COMPARISON:  11/20/2016 and 12/17/2011 chest x-ray. No comparison chest CT. FINDINGS: Cardiovascular: Heart minimally deformed by a elevated left hemidiaphragm. No aneurysmal dilation of the thoracic aorta. Scattered atherosclerotic changes. Slight prominence pulmonary arteries.  Mediastinum/Nodes: Normal size lymph nodes. Small hiatal hernia. Lungs/Pleura: Bilateral parenchymal changes greatest in the lung bases and left upper lobe. Findings suspicious for infectious infiltrates superimposed upon chronic changes/ bronchiectasis. Trachea and mainstem bronchi are patent. Mild pulmonary vascular congestion. Elevated left hemidiaphragm. Upper Abdomen: Mild hyperplasia adrenal glands. Scarring inferior aspect left kidney. Musculoskeletal: No destructive lesion or compression fracture. IMPRESSION: Bilateral parenchymal changes greatest in the lung bases and left upper lobe. Findings suspicious for infectious infiltrates superimposed upon chronic changes/ bronchiectasis. Mild pulmonary vascular congestion. Elevated left hemidiaphragm. Electronically Signed   By: Lacy Duverney M.D.   On: 11/21/2016 18:30   Dg Chest Port 1 View  Result Date: 11/20/2016 CLINICAL DATA:  Shortness of breath for 3 weeks. EXAM: PORTABLE CHEST 1 VIEW COMPARISON:  11/17/2016 and prior exams FINDINGS: Diffuse bilateral airspace opacities are stable to slightly increased from the prior study. Cardiomediastinal silhouette is unchanged. There is no evidence of pneumothorax or definite pleural effusion. No acute bony abnormalities are noted. IMPRESSION: Stable to slight increase in diffuse bilateral airspace disease. Electronically Signed   By: Harmon Pier M.D.   On: 11/20/2016 17:19   Dg Chest Port 1 View  Result Date: 11/17/2016 CLINICAL DATA:  Respiratory failure. EXAM: PORTABLE CHEST 1 VIEW COMPARISON:  11/11/2016. FINDINGS: Mediastinum is stable. Heart size stable. Persistent unchanged bilateral pulmonary infiltrates and/or edema. Low lung volumes. No prominent pleural effusion. No pneumothorax . Stable elevation left hemidiaphragm. Interposition of the colon left hemidiaphragm again noted. IMPRESSION: Persistent prominent bilateral pulmonary infiltrates and or edema. No significant change. Electronically Signed    By: Maisie Fus  Register   On: 11/17/2016 07:30   Dg Chest Portable 1 View  Result Date: 11/11/2016 CLINICAL DATA:  Shortness of breath with cough; left-sided chest pain EXAM: PORTABLE CHEST 1 VIEW COMPARISON:  December 17, 2011 FINDINGS: There is diffuse interstitial edema with patchy consolidation in the right upper lobe. Heart is upper normal in size with pulmonary venous hypertension. No evident adenopathy. No bone lesions. Slight elevation of the left hemidiaphragm is stable. No pneumothorax. IMPRESSION: Findings indicative of congestive heart failure. Question alveolar edema versus superimposed pneumonia right upper lobe. Stable cardiac silhouette. There is pulmonary venous hypertension. Electronically Signed   By: Bretta Bang III M.D.   On: 11/11/2016 11:27    Microbiology: No results found for this or any previous visit (from the past 240 hour(s)).   Labs: Basic Metabolic Panel:  Recent Labs Lab 11/19/16 0710 11/20/16 0629 11/21/16 0722 11/22/16 0516 11/23/16 0603  NA 130* 130* 128* 130* 133*  K 3.7 4.0 4.3 4.5 4.1  CL 88* 88* 87* 92* 98*  CO2 29 28 29 30 29   GLUCOSE 98 94 126* 114* 113*  BUN 23* 29* 40* 43* 29*  CREATININE 1.10* 1.24* 1.33* 1.23* 0.94  CALCIUM 9.0 9.2 9.3 8.9 8.6*   Liver Function Tests: No results for input(s): AST, ALT, ALKPHOS, BILITOT, PROT, ALBUMIN in the last 168 hours. No results for input(s): LIPASE, AMYLASE in the last 168 hours. No results for input(s): AMMONIA in the last 168 hours. CBC:  Recent Labs Lab 11/20/16 0629 11/22/16 0516 11/23/16 0603  WBC 7.8 10.0 8.1  HGB 12.4 11.7* 11.1*  HCT 38.5 36.8 35.1*  MCV 81.2 80.7 81.8  PLT 387 357 325   Cardiac Enzymes: No results for input(s): CKTOTAL, CKMB, CKMBINDEX, TROPONINI in the last 168 hours. BNP: BNP (last 3 results)  Recent Labs  11/11/16 1110 11/21/16 0722  BNP 74.0 21.0    ProBNP (last 3 results) No results for input(s): PROBNP in the last 8760 hours.  CBG: No  results for input(s): GLUCAP in the last 168 hours.     SignedRhetta Mura MD   [facility] 11/25/2016, 10:45 AM

## 2016-11-25 NOTE — Progress Notes (Signed)
Report called to Lily KocherGeorge Nijri RN at the [facility].

## 2016-11-26 ENCOUNTER — Inpatient Hospital Stay
Admission: RE | Admit: 2016-11-26 | Discharge: 2016-12-31 | Disposition: A | Discharge status: Home / Self Care | Payer: Medicare Other | Source: Ambulatory Visit | Attending: Internal Medicine | Admitting: Internal Medicine

## 2016-11-26 ENCOUNTER — Emergency Department (HOSPITAL_COMMUNITY)
Admission: EM | Admit: 2016-11-26 | Discharge: 2016-11-26 | Disposition: A | Discharge status: Home / Self Care | Payer: Medicare Other | Attending: Emergency Medicine | Admitting: Emergency Medicine

## 2016-11-26 ENCOUNTER — Encounter (HOSPITAL_COMMUNITY): Payer: Self-pay | Admitting: Emergency Medicine

## 2016-11-26 ENCOUNTER — Emergency Department (HOSPITAL_COMMUNITY): Payer: Medicare Other

## 2016-11-26 ENCOUNTER — Encounter (HOSPITAL_COMMUNITY)
Admission: RE | Admit: 2016-11-26 | Discharge: 2016-11-26 | Disposition: A | Discharge status: Home / Self Care | Payer: Medicare Other | Source: Skilled Nursing Facility | Attending: Pediatrics | Admitting: Pediatrics

## 2016-11-26 ENCOUNTER — Other Ambulatory Visit: Payer: Self-pay

## 2016-11-26 DIAGNOSIS — R51 Headache: Secondary | ICD-10-CM | POA: Diagnosis present

## 2016-11-26 DIAGNOSIS — F0390 Unspecified dementia without behavioral disturbance: Secondary | ICD-10-CM | POA: Insufficient documentation

## 2016-11-26 DIAGNOSIS — Z7982 Long term (current) use of aspirin: Secondary | ICD-10-CM | POA: Diagnosis not present

## 2016-11-26 DIAGNOSIS — Z79899 Other long term (current) drug therapy: Secondary | ICD-10-CM | POA: Insufficient documentation

## 2016-11-26 DIAGNOSIS — Z87891 Personal history of nicotine dependence: Secondary | ICD-10-CM | POA: Insufficient documentation

## 2016-11-26 DIAGNOSIS — J101 Influenza due to other identified influenza virus with other respiratory manifestations: Secondary | ICD-10-CM | POA: Insufficient documentation

## 2016-11-26 DIAGNOSIS — J449 Chronic obstructive pulmonary disease, unspecified: Secondary | ICD-10-CM | POA: Insufficient documentation

## 2016-11-26 DIAGNOSIS — I5032 Chronic diastolic (congestive) heart failure: Secondary | ICD-10-CM | POA: Insufficient documentation

## 2016-11-26 DIAGNOSIS — R0902 Hypoxemia: Secondary | ICD-10-CM | POA: Diagnosis not present

## 2016-11-26 DIAGNOSIS — I509 Heart failure, unspecified: Secondary | ICD-10-CM | POA: Diagnosis not present

## 2016-11-26 LAB — COMPREHENSIVE METABOLIC PANEL
ALT: 17 U/L (ref 14–54)
AST: 23 U/L (ref 15–41)
Albumin: 2.4 g/dL — ABNORMAL LOW (ref 3.5–5.0)
Alkaline Phosphatase: 40 U/L (ref 38–126)
Anion gap: 7 (ref 5–15)
BUN: 15 mg/dL (ref 6–20)
CHLORIDE: 99 mmol/L — AB (ref 101–111)
CO2: 29 mmol/L (ref 22–32)
CREATININE: 0.95 mg/dL (ref 0.44–1.00)
Calcium: 8.8 mg/dL — ABNORMAL LOW (ref 8.9–10.3)
GFR calc non Af Amer: 56 mL/min — ABNORMAL LOW (ref >60)
Glucose, Bld: 125 mg/dL — ABNORMAL HIGH (ref 65–99)
POTASSIUM: 3.4 mmol/L — AB (ref 3.5–5.1)
SODIUM: 135 mmol/L (ref 135–145)
Total Bilirubin: 0.8 mg/dL (ref 0.3–1.2)
Total Protein: 6.2 g/dL — ABNORMAL LOW (ref 6.5–8.1)

## 2016-11-26 LAB — LIPASE, BLOOD: Lipase: 15 U/L (ref 11–51)

## 2016-11-26 LAB — BASIC METABOLIC PANEL
Anion gap: 8 (ref 5–15)
BUN: 16 mg/dL (ref 6–20)
CALCIUM: 9 mg/dL (ref 8.9–10.3)
CO2: 30 mmol/L (ref 22–32)
CREATININE: 0.92 mg/dL (ref 0.44–1.00)
Chloride: 98 mmol/L — ABNORMAL LOW (ref 101–111)
GFR calc Af Amer: >60 mL/min (ref >60)
GFR calc non Af Amer: 58 mL/min — ABNORMAL LOW (ref >60)
GLUCOSE: 102 mg/dL — AB (ref 65–99)
Potassium: 3.4 mmol/L — ABNORMAL LOW (ref 3.5–5.1)
Sodium: 136 mmol/L (ref 135–145)

## 2016-11-26 LAB — CBC
HCT: 39.9 % (ref 36.0–46.0)
Hemoglobin: 12.7 g/dL (ref 12.0–15.0)
MCH: 26.2 pg (ref 26.0–34.0)
MCHC: 31.8 g/dL (ref 30.0–36.0)
MCV: 82.3 fL (ref 78.0–100.0)
PLATELETS: 299 10*3/uL (ref 150–400)
RBC: 4.85 MIL/uL (ref 3.87–5.11)
RDW: 14.4 % (ref 11.5–15.5)
WBC: 15 10*3/uL — ABNORMAL HIGH (ref 4.0–10.5)

## 2016-11-26 LAB — CBC WITH DIFFERENTIAL/PLATELET
BASOS ABS: 0 10*3/uL (ref 0.0–0.1)
Basophils Relative: 0 %
EOS ABS: 0.1 10*3/uL (ref 0.0–0.7)
EOS PCT: 1 %
HCT: 37.5 % (ref 36.0–46.0)
Hemoglobin: 12.2 g/dL (ref 12.0–15.0)
Lymphocytes Relative: 8 %
Lymphs Abs: 1 10*3/uL (ref 0.7–4.0)
MCH: 26.6 pg (ref 26.0–34.0)
MCHC: 32.5 g/dL (ref 30.0–36.0)
MCV: 81.7 fL (ref 78.0–100.0)
Monocytes Absolute: 1.2 10*3/uL — ABNORMAL HIGH (ref 0.1–1.0)
Monocytes Relative: 9 %
Neutro Abs: 10.7 10*3/uL — ABNORMAL HIGH (ref 1.7–7.7)
Neutrophils Relative %: 82 %
PLATELETS: 279 10*3/uL (ref 150–400)
RBC: 4.59 MIL/uL (ref 3.87–5.11)
RDW: 14.4 % (ref 11.5–15.5)
WBC: 13 10*3/uL — AB (ref 4.0–10.5)

## 2016-11-26 LAB — INFLUENZA PANEL BY PCR (TYPE A & B)
INFLAPCR: POSITIVE — AB
INFLBPCR: NEGATIVE

## 2016-11-26 LAB — I-STAT CG4 LACTIC ACID, ED: LACTIC ACID, VENOUS: 1.24 mmol/L (ref 0.5–1.9)

## 2016-11-26 MED ORDER — SODIUM CHLORIDE 0.9 % IV SOLN
Freq: Once | INTRAVENOUS | Status: AC
  Administered 2016-11-26: 09:00:00 via INTRAVENOUS

## 2016-11-26 NOTE — ED Notes (Signed)
Patient transported to X-ray 

## 2016-11-26 NOTE — ED Notes (Signed)
o2 turned down to 2l via [place]

## 2016-11-26 NOTE — ED Triage Notes (Signed)
PT is a new resident at [facility] as of yesterday with recent hospitalization for pneumonia with sepsis. PT states she had several episodes of vomiting yesterday evening with headache and cough. Staff at [facility] reports tachycardia and increased WBC this am. PT alert and oriented upon arrival.

## 2016-11-26 NOTE — Discharge Instructions (Signed)
Chest x-ray shows no change.  I suspect Heather Miller is deconditioned which would explain why her oxygen dropped earlier today. Continue current treatment.

## 2016-11-26 NOTE — ED Notes (Signed)
Reminded pt I still a urine sample

## 2016-11-26 NOTE — ED Notes (Addendum)
Pt became sob during transfer from potty chair to bed, sats dropped down to mid 70's. Place on o2 at 6l via [place].  sats went up to 94%.  EDP made aware.

## 2016-11-26 NOTE — ED Notes (Signed)
Pt given fluids to drink . Family ay bedside

## 2016-11-26 NOTE — ED Provider Notes (Signed)
[facility] Provider Note   CSN: [id number] Arrival date & time: 11/26/16  1914  By signing my name below, I, Majel Homer, attest that this documentation has been prepared under the direction and in the presence of Donnetta Hutching, MD . Electronically Signed: Majel Homer, Scribe. 11/26/2016. 9:01 AM.  History   Chief Complaint Chief Complaint  Patient presents with  . Abnormal Lab   The history is provided by the patient. No language interpreter was used.   LEVEL V CAVEAT: HPI and ROS limited due to Mild Dementia  HPI Comments: Heather Miller is a 81 y.o. female with PMHx of arthritis and blindness, who presents to the Emergency Department complaining of gradually worsening, cough and headache that began last night. Pt reports she was recently admitted to [facility] after a 15 day stay at [facility] for therapy s/p a diagnosis of pneumonia and early sepsis on 11/11/16. Per nursing staff at [facility], pt experienced multiple episodes of vomiting soon after arrival yesterday with tachycardia and states her WBC increased from 8 to 15 this morning. Pt denies dysuria and decreased appetite. She notes she lives with her daughter who is her caregiver at home.   Past Medical History:  Diagnosis Date  . Arthritis   . Blind   . Complication of anesthesia   . Kidney stones   . Macular degeneration   . PONV (postoperative nausea and vomiting)   . Varicose vein of leg     Patient Active Problem List   Diagnosis Date Noted  . COPD exacerbation (HCC) 11/12/2016  . Acute respiratory failure with hypoxia (HCC) 11/11/2016  . CAP (community acquired pneumonia) 11/11/2016  . Congestive heart failure (CHF) (HCC) 11/11/2016  . Hypokalemia 11/11/2016  . MACULAR DEGENERATION 07/15/2009  . CHEST PAIN 07/15/2009    Past Surgical History:  Procedure Laterality Date  . CYSTOSCOPY W/ URETERAL STENT PLACEMENT  05/10/2011   Procedure: CYSTOSCOPY WITH STENT REPLACEMENT;  Surgeon: Ky Barban;  Location: [facility];  Service: Urology;  Laterality: Bilateral;  . CYSTOSCOPY/RETROGRADE/URETEROSCOPY  05/10/2011   Procedure: CYSTOSCOPY/RETROGRADE/URETEROSCOPY;  Surgeon: Ky Barban;  Location: [facility];  Service: Urology;  Laterality: Bilateral;  no stone on right side  . EYE SURGERY  50's   detached retina  . KNEE ARTHROSCOPY     Right, [facility]    OB History    Gravida Para Term Preterm AB Living   3         3   SAB TAB Ectopic Multiple Live Births                 Home Medications    Prior to Admission medications   Medication Sig Start Date End Date Taking? Authorizing Provider  aspirin EC 81 MG tablet Take 81 mg by mouth daily.   Yes Historical Provider, MD  ibuprofen (ADVIL,MOTRIN) 200 MG tablet Take 200 mg by mouth every 8 (eight) hours as needed for mild pain.   Yes Historical Provider, MD  levofloxacin (LEVAQUIN) 500 MG tablet Take 1 tablet (500 mg total) by mouth daily. 11/26/16  Yes Rhetta Mura, MD  predniSONE (DELTASONE) 20 MG tablet Take 40 mg by mouth daily with breakfast.   Yes Historical Provider, MD    Family History History reviewed. No pertinent family history.  Social History Social History  Substance Use Topics  . Smoking status: Former Games developer  . Smokeless tobacco: Never Used  . Alcohol use No   Allergies   Patient has  no known allergies.  Review of Systems Review of Systems  Unable to perform ROS: Dementia   Physical Exam Updated Vital Signs BP 116/76 (BP Location: Left Arm)   Pulse 120   Temp 99.8 F (37.7 C) (Oral)   Resp 20   Ht 5\' 8"  (1.727 m)   Wt 211 lb (95.7 kg)   SpO2 96%   BMI 32.08 kg/m   Physical Exam  Constitutional: She is oriented to person, place, and time.  No acute distress, obese.  HENT:  Head: Normocephalic and atraumatic.  Right eyelid is permanently closed  Eyes: Conjunctivae are normal.  Neck: Neck supple.  Cardiovascular: Normal rate and regular rhythm.   Pulmonary/Chest: Effort normal  and breath sounds normal.  Abdominal: Soft. Bowel sounds are normal.  Musculoskeletal: Normal range of motion.  Neurological: She is alert and oriented to person, place, and time.  Skin: Skin is warm and dry.  Psychiatric: She has a normal mood and affect. Her behavior is normal.  Nursing note and vitals reviewed.  ED Treatments / Results  DIAGNOSTIC STUDIES:  Oxygen Saturation is 96% on RA, normal by my interpretation.    COORDINATION OF CARE:  8:40 AM Discussed treatment plan with pt at bedside and pt agreed to plan. Will obtain basic labs and urinalysis.   Labs (all labs ordered are listed, but only abnormal results are displayed) Labs Reviewed  CBC WITH DIFFERENTIAL/PLATELET - Abnormal; Notable for the following:       Result Value   WBC 13.0 (*)    Neutro Abs 10.7 (*)    Monocytes Absolute 1.2 (*)    All other components within normal limits  COMPREHENSIVE METABOLIC PANEL - Abnormal; Notable for the following:    Potassium 3.4 (*)    Chloride 99 (*)    Glucose, Bld 125 (*)    Calcium 8.8 (*)    Total Protein 6.2 (*)    Albumin 2.4 (*)    GFR calc non Af Amer 56 (*)    All other components within normal limits  LIPASE, BLOOD  URINALYSIS, ROUTINE W REFLEX MICROSCOPIC  I-STAT CG4 LACTIC ACID, ED    EKG  EKG Interpretation None       Radiology Dg Chest 2 View  Result Date: 11/26/2016 CLINICAL DATA:  Recent hospitalization for pneumonia. Vomiting and fever last night. EXAM: CHEST  2 VIEW COMPARISON:  Radiographs 11/17/2016 and 11/20/2016.  CT 11/21/2016. FINDINGS: There are persistent low lung volumes with asymmetric elevation of the left hemidiaphragm. The heart size and mediastinal contours are stable. There are persistent bilateral airspace opacities which are similar to the recent prior studies. No progressive airspace disease, significant pleural effusion or pneumothorax identified. The bones appear unchanged. Mild degenerative changes are present within the  spine and both shoulders. IMPRESSION: No significant change in residual bilateral airspace opacities consistent with partially resolved pneumonia. No new findings seen. Electronically Signed   By: Carey BullocksWilliam  Veazey M.D.   On: 11/26/2016 09:48   Procedures Procedures (including critical care time)  Medications Ordered in ED Medications  0.9 %  sodium chloride infusion ( Intravenous New Bag/Given 11/26/16 0845)    Initial Impression / Assessment and Plan / ED Course  I have reviewed the triage vital signs and the nursing notes.  Pertinent labs & imaging results that were available during my care of the patient were reviewed by me and considered in my medical decision making (see chart for details).     I personally performed the services  described in this documentation, which was scribed in my presence. The recorded information has been reviewed and is accurate.   Patient is in no acute distress. She is resting comfortably. Chest x-ray shows no significant changes. I suspect her desaturation earlier today was secondary to profound deconditioning from her recent illness. Final Clinical Impressions(s) / ED Diagnoses   Final diagnoses:  Oxygen desaturation    New Prescriptions New Prescriptions   No medications on file     Donnetta Hutching, MD 11/26/16 1344

## 2016-11-26 NOTE — ED Notes (Signed)
Advised pt we neededa urine sample states she can use the bedside but she does not need to go at the time

## 2016-11-26 NOTE — ED Notes (Signed)
Pt place on bedside commode

## 2016-11-28 ENCOUNTER — Encounter (HOSPITAL_COMMUNITY)
Admission: RE | Admit: 2016-11-28 | Discharge: 2016-11-28 | Disposition: A | Discharge status: Home / Self Care | Payer: Medicare Other | Source: Skilled Nursing Facility | Attending: Internal Medicine | Admitting: Internal Medicine

## 2016-11-28 ENCOUNTER — Non-Acute Institutional Stay (SKILLED_NURSING_FACILITY): Payer: Medicare Other | Admitting: Internal Medicine

## 2016-11-28 ENCOUNTER — Encounter: Payer: Self-pay | Admitting: Internal Medicine

## 2016-11-28 DIAGNOSIS — E876 Hypokalemia: Secondary | ICD-10-CM

## 2016-11-28 DIAGNOSIS — I5032 Chronic diastolic (congestive) heart failure: Secondary | ICD-10-CM | POA: Insufficient documentation

## 2016-11-28 DIAGNOSIS — J449 Chronic obstructive pulmonary disease, unspecified: Secondary | ICD-10-CM | POA: Insufficient documentation

## 2016-11-28 DIAGNOSIS — J9601 Acute respiratory failure with hypoxia: Secondary | ICD-10-CM

## 2016-11-28 DIAGNOSIS — I5031 Acute diastolic (congestive) heart failure: Secondary | ICD-10-CM | POA: Diagnosis not present

## 2016-11-28 DIAGNOSIS — J9691 Respiratory failure, unspecified with hypoxia: Secondary | ICD-10-CM | POA: Insufficient documentation

## 2016-11-28 DIAGNOSIS — J189 Pneumonia, unspecified organism: Secondary | ICD-10-CM | POA: Diagnosis not present

## 2016-11-28 DIAGNOSIS — J111 Influenza due to unidentified influenza virus with other respiratory manifestations: Secondary | ICD-10-CM | POA: Diagnosis not present

## 2016-11-28 DIAGNOSIS — J101 Influenza due to other identified influenza virus with other respiratory manifestations: Secondary | ICD-10-CM | POA: Insufficient documentation

## 2016-11-28 LAB — CBC WITH DIFFERENTIAL/PLATELET
BASOS ABS: 0 10*3/uL (ref 0.0–0.1)
Basophils Relative: 0 %
EOS PCT: 1 %
Eosinophils Absolute: 0.1 10*3/uL (ref 0.0–0.7)
HCT: 33.5 % — ABNORMAL LOW (ref 36.0–46.0)
Hemoglobin: 10.7 g/dL — ABNORMAL LOW (ref 12.0–15.0)
LYMPHS PCT: 8 %
Lymphs Abs: 0.9 10*3/uL (ref 0.7–4.0)
MCH: 26.3 pg (ref 26.0–34.0)
MCHC: 31.9 g/dL (ref 30.0–36.0)
MCV: 82.3 fL (ref 78.0–100.0)
Monocytes Absolute: 1.2 10*3/uL — ABNORMAL HIGH (ref 0.1–1.0)
Monocytes Relative: 11 %
NEUTROS ABS: 8.9 10*3/uL — AB (ref 1.7–7.7)
Neutrophils Relative %: 80 %
PLATELETS: 228 10*3/uL (ref 150–400)
RBC: 4.07 MIL/uL (ref 3.87–5.11)
RDW: 14.7 % (ref 11.5–15.5)
WBC: 11 10*3/uL — ABNORMAL HIGH (ref 4.0–10.5)

## 2016-11-28 LAB — BASIC METABOLIC PANEL
Anion gap: 7 (ref 5–15)
BUN: 14 mg/dL (ref 6–20)
CALCIUM: 8.2 mg/dL — AB (ref 8.9–10.3)
CO2: 30 mmol/L (ref 22–32)
Chloride: 99 mmol/L — ABNORMAL LOW (ref 101–111)
Creatinine, Ser: 0.85 mg/dL (ref 0.44–1.00)
GFR calc Af Amer: >60 mL/min (ref >60)
GLUCOSE: 95 mg/dL (ref 65–99)
POTASSIUM: 3.2 mmol/L — AB (ref 3.5–5.1)
SODIUM: 136 mmol/L (ref 135–145)

## 2016-11-28 NOTE — Progress Notes (Signed)
Provider:  Einar Crow Location:   [facility] Room Number: 124/p Place of Service:  SNF (31)  PCP: No PCP Per Patient Patient Care Team: No Pcp Per Patient as PCP - General (General Practice)  Extended Emergency Contact Information Primary Emergency Contact: [facility] Address: [address]          [place], [place] [address] [place] Home Phone: [phone] Mobile Phone: [phone] Relation: Daughter  Code Status: Full Code Goals of Care: Advanced Directive information Advanced Directives 11/28/2016  Does Patient Have a Medical Advance Directive? Yes  Type of Advance Directive (No Data)  Does patient want to make changes to medical advance directive? No - Patient declined  Would patient like information on creating a medical advance directive? No - Patient declined  Pre-existing out of facility DNR order (yellow form or pink MOST form) -      Chief Complaint  Patient presents with  . New Admit To SNF    HPI: Patient is a 81 y.o. female seen today for admission to SNF for therapy. Patient has H/O Blindness due to Macular degeneration, and arthritis. She was initially admitted to the hospital for Increaseing SOB.  She was admitted initially and was treated for Pulmonary edema with IV lasix. She was also treated with IV antibiotics. Patient Hypoxia was resolved. But in facility she had continuous Nausea vomiting and now has Influenza. She did not get any Flu shot this year. She is on Tamiflu now since yesterday and is feeling better.  She continuous to C/O Productive Cough. She is also having mild SOB. No chest pain just feels congested in chest. No Fever or chills. Appetite not very good. But no Nausea or vomiting or abdominal Pain. Patient is legally blind but lives by herself. Her Daughter who lives [place] helps her with House hold work.  Past Medical History:  Diagnosis Date  . Arthritis   . Blind   . Complication of  anesthesia   . Kidney stones   . Macular degeneration   . PONV (postoperative nausea and vomiting)   . Varicose vein of leg    Past Surgical History:  Procedure Laterality Date  . CYSTOSCOPY W/ URETERAL STENT PLACEMENT  05/10/2011   Procedure: CYSTOSCOPY WITH STENT REPLACEMENT;  Surgeon: Ky Barban;  Location: [facility];  Service: Urology;  Laterality: Bilateral;  . CYSTOSCOPY/RETROGRADE/URETEROSCOPY  05/10/2011   Procedure: CYSTOSCOPY/RETROGRADE/URETEROSCOPY;  Surgeon: Ky Barban;  Location: [facility];  Service: Urology;  Laterality: Bilateral;  no stone on right side  . EYE SURGERY  50's   detached retina  . KNEE ARTHROSCOPY     Right, [facility]    reports that she has quit smoking. She has never used smokeless tobacco. She reports that she does not drink alcohol or use drugs. Social History   Social History  . Marital status: Widowed    Spouse name: N/A  . Number of children: N/A  . Years of education: N/A   Occupational History  . Not on file.   Social History Main Topics  . Smoking status: Former Games developer  . Smokeless tobacco: Never Used  . Alcohol use No  . Drug use: No  . Sexual activity: Not on file   Other Topics Concern  . Not on file   Social History Narrative  . No narrative on file    Functional Status Survey:    History reviewed. No pertinent family history.  There are no preventive care reminders to display for  this patient.  No Known Allergies  Allergies as of 11/28/2016   No Known Allergies     Medication List       Accurate as of 11/28/16 10:20 AM. Always use your most recent med list.          acetaminophen 325 MG tablet Commonly known as:  TYLENOL Take 650 mg by mouth every 4 (four) hours as needed.   aspirin EC 81 MG tablet Take 81 mg by mouth daily.   ibuprofen 200 MG tablet Commonly known as:  ADVIL,MOTRIN Take 200 mg by mouth every 8 (eight) hours as needed for mild pain.   levofloxacin 500 MG tablet Commonly known  as:  LEVAQUIN Take 1 tablet (500 mg total) by mouth daily.   nystatin 100000 UNIT/ML suspension Commonly known as:  MYCOSTATIN Take 5 mLs by mouth 4 (four) times daily. Until 12/03/2016   oseltamivir 75 MG capsule Commonly known as:  TAMIFLU Take 75 mg by mouth 2 (two) times daily.   OXYGEN Oxygen @@ 2 L /mn via every q shift   predniSONE 20 MG tablet Commonly known as:  DELTASONE Take 40 mg by mouth daily with breakfast.   RISA-BID PROBIOTIC Tabs Give 1 tablet by mouth twice a day       Review of Systems  Constitutional: Positive for activity change and appetite change. Negative for chills, diaphoresis, fatigue and fever.  HENT: Positive for congestion. Negative for rhinorrhea, sinus pressure and sneezing.   Respiratory: Positive for cough and shortness of breath. Negative for chest tightness and wheezing.   Cardiovascular: Negative for chest pain, palpitations and leg swelling.  Gastrointestinal: Negative.   Genitourinary: Negative for difficulty urinating, dysuria and frequency.  Musculoskeletal: Positive for arthralgias. Negative for back pain, gait problem and joint swelling.  Skin: Negative.   Neurological: Positive for weakness. Negative for dizziness, tremors, seizures, speech difficulty, light-headedness and numbness.  Psychiatric/Behavioral: Positive for sleep disturbance. Negative for agitation, behavioral problems, confusion and dysphoric mood. The patient is not nervous/anxious.     Vitals:   11/28/16 1019  BP: 124/69  Pulse: (!) 104  Resp: 20  Temp: 99.7 F (37.6 C)  TempSrc: Oral  SpO2: 92%   There is no height or weight on file to calculate BMI. Physical Exam  Constitutional: She is oriented to person, place, and time. She appears well-developed and well-nourished.  HENT:  Head: Normocephalic.  Mouth/Throat: Oropharynx is clear and moist.  Neck: Neck supple.  Cardiovascular: Normal rate, regular rhythm and normal heart sounds.   No murmur  heard. Pulmonary/Chest: Effort normal and breath sounds normal. No respiratory distress. She has no wheezes. She has no rales.  Abdominal: Soft. Bowel sounds are normal. There is no tenderness. There is no rebound.  Musculoskeletal: She exhibits edema.  Neurological: She is alert and oriented to person, place, and time.  Moves All extremities well.  Skin: Skin is warm and dry.  Psychiatric: She has a normal mood and affect. Her behavior is normal. Judgment and thought content normal.    Labs reviewed: Basic Metabolic Panel:  Recent Labs  65/78/4601/01/08 0505 11/26/16 0840 11/28/16 0700  NA 136 135 136  K 3.4* 3.4* 3.2*  CL 98* 99* 99*  CO2 30 29 30   GLUCOSE 102* 125* 95  BUN 16 15 14   CREATININE 0.92 0.95 0.85  CALCIUM 9.0 8.8* 8.2*   Liver Function Tests:  Recent Labs  11/26/16 0840  AST 23  ALT 17  ALKPHOS 40  BILITOT 0.8  PROT 6.2*  ALBUMIN 2.4*    Recent Labs  11/26/16 0840  LIPASE 15   No results for input(s): AMMONIA in the last 8760 hours. CBC:  Recent Labs  11/11/16 1110  11/26/16 0505 11/26/16 0840 11/28/16 0700  WBC 13.7*  < > 15.0* 13.0* 11.0*  NEUTROABS 12.3*  --   --  10.7* 8.9*  HGB 12.6  < > 12.7 12.2 10.7*  HCT 38.6  < > 39.9 37.5 33.5*  MCV 83.4  < > 82.3 81.7 82.3  PLT 401*  < > 299 279 228  < > = values in this interval not displayed. Cardiac Enzymes:  Recent Labs  11/11/16 1110  TROPONINI <0.03   BNP: Invalid input(s): POCBNP No results found for: HGBA1C No results found for: TSH No results found for: VITAMINB12 No results found for: FOLATE No results found for: IRON, TIBC, FERRITIN  Imaging and Procedures obtained prior to SNF admission: No results found.  Assessment/Plan Influenza with Community Acquired Pneumonia  Patient tested positive for Influenza and she was started on Tamiflu on 02/04. Will continue Isolation. Hold Therapy for 48 hrs while she Continues on Tamiflu. Also will continue Levaquin. Patient on  Prednisone for 5 Days. Continue same. Repeat CX Ray in 1 week. Also start on Mucinex and Tessalon For Cough.  Congestive heart Failure Patient has diastolic failure  And was treated with Lasix in the hospital. She was not discharged on that right now.  Will continue to monitor weight. Repeat CXray in 1 week. Conitnue Oxygen and POX. Hypokalemia Supplement Potassium.  Anemia Patient Hgb is low at 10.7 from 12.2 Will repeat and follow. Family/ staff Communication:   Labs/tests ordered: CBC , CMP and Cxray in 1 week.

## 2016-12-05 ENCOUNTER — Encounter (HOSPITAL_COMMUNITY)
Admission: RE | Admit: 2016-12-05 | Discharge: 2016-12-05 | Disposition: A | Discharge status: Home / Self Care | Payer: Medicare Other | Source: Skilled Nursing Facility | Attending: Internal Medicine | Admitting: Internal Medicine

## 2016-12-05 DIAGNOSIS — J9691 Respiratory failure, unspecified with hypoxia: Secondary | ICD-10-CM | POA: Insufficient documentation

## 2016-12-05 DIAGNOSIS — J189 Pneumonia, unspecified organism: Secondary | ICD-10-CM | POA: Insufficient documentation

## 2016-12-05 DIAGNOSIS — J101 Influenza due to other identified influenza virus with other respiratory manifestations: Secondary | ICD-10-CM | POA: Insufficient documentation

## 2016-12-05 DIAGNOSIS — J449 Chronic obstructive pulmonary disease, unspecified: Secondary | ICD-10-CM | POA: Insufficient documentation

## 2016-12-05 LAB — CBC
HEMATOCRIT: 32.5 % — AB (ref 36.0–46.0)
HEMOGLOBIN: 10.3 g/dL — AB (ref 12.0–15.0)
MCH: 26.1 pg (ref 26.0–34.0)
MCHC: 31.7 g/dL (ref 30.0–36.0)
MCV: 82.5 fL (ref 78.0–100.0)
Platelets: 202 10*3/uL (ref 150–400)
RBC: 3.94 MIL/uL (ref 3.87–5.11)
RDW: 15.4 % (ref 11.5–15.5)
WBC: 5.9 10*3/uL (ref 4.0–10.5)

## 2016-12-05 LAB — COMPREHENSIVE METABOLIC PANEL
ALBUMIN: 2.2 g/dL — AB (ref 3.5–5.0)
ALK PHOS: 38 U/L (ref 38–126)
ALT: 59 U/L — AB (ref 14–54)
ANION GAP: 6 (ref 5–15)
AST: 35 U/L (ref 15–41)
BILIRUBIN TOTAL: 0.6 mg/dL (ref 0.3–1.2)
BUN: 10 mg/dL (ref 6–20)
CALCIUM: 8.5 mg/dL — AB (ref 8.9–10.3)
CO2: 31 mmol/L (ref 22–32)
CREATININE: 0.72 mg/dL (ref 0.44–1.00)
Chloride: 100 mmol/L — ABNORMAL LOW (ref 101–111)
GFR calc Af Amer: >60 mL/min (ref >60)
GFR calc non Af Amer: >60 mL/min (ref >60)
GLUCOSE: 83 mg/dL (ref 65–99)
Potassium: 3.5 mmol/L (ref 3.5–5.1)
Sodium: 137 mmol/L (ref 135–145)
TOTAL PROTEIN: 5.5 g/dL — AB (ref 6.5–8.1)

## 2016-12-06 ENCOUNTER — Encounter: Payer: Self-pay | Admitting: Internal Medicine

## 2016-12-06 ENCOUNTER — Non-Acute Institutional Stay (SKILLED_NURSING_FACILITY): Payer: Medicare Other | Admitting: Internal Medicine

## 2016-12-06 DIAGNOSIS — J189 Pneumonia, unspecified organism: Secondary | ICD-10-CM | POA: Diagnosis not present

## 2016-12-06 DIAGNOSIS — J111 Influenza due to unidentified influenza virus with other respiratory manifestations: Secondary | ICD-10-CM

## 2016-12-06 DIAGNOSIS — I5031 Acute diastolic (congestive) heart failure: Secondary | ICD-10-CM | POA: Diagnosis not present

## 2016-12-06 NOTE — Progress Notes (Signed)
Location:  [facility]   Place of Service:  SNF [phone](31) Provider:  Einar CrowAnjali, Gupta MD  No PCP Per Patient  Patient Care Team: No Pcp Per Patient as PCP - General (General Practice)  Extended Emergency Contact Information Primary Emergency Contact: [facility] Address: [address]          [place], [place] [address] [place] Home Phone: [phone] Mobile Phone: [phone] Relation: Daughter  Code Status:  Full Code Goals of care: Advanced Directive information Advanced Directives 11/28/2016  Does Patient Have a Medical Advance Directive? Yes  Type of Advance Directive (No Data)  Does patient want to make changes to medical advance directive? No - Patient declined  Would patient like information on creating a medical advance directive? No - Patient declined  Pre-existing out of facility DNR order (yellow form or pink MOST form) -     Acute visit for Follow up on Chest Xray and Pneumonia.   HPI:  Pt is a 81 y.o. female seen today for an acute visit for Follow up on her Chest Xray. Patient Has H/o Blindness Due to Macular Degeneration and Arthritis. She was recently diagnosed with Influenza and Pneumonia. She finished the course of Tamiflu and Levaquin.   Past Medical History:  Diagnosis Date  . Arthritis   . Blind   . Complication of anesthesia   . Kidney stones   . Macular degeneration   . PONV (postoperative nausea and vomiting)   . Varicose vein of leg    Past Surgical History:  Procedure Laterality Date  . CYSTOSCOPY W/ URETERAL STENT PLACEMENT  05/10/2011   Procedure: CYSTOSCOPY WITH STENT REPLACEMENT;  Surgeon: Ky BarbanMohammad I Javaid;  Location: [facility];  Service: Urology;  Laterality: Bilateral;  . CYSTOSCOPY/RETROGRADE/URETEROSCOPY  05/10/2011   Procedure: CYSTOSCOPY/RETROGRADE/URETEROSCOPY;  Surgeon: Ky BarbanMohammad I Javaid;  Location: [facility];  Service: Urology;  Laterality: Bilateral;  no stone on right side  . EYE SURGERY  50's   detached retina  . KNEE ARTHROSCOPY     Right, [facility]    No Known Allergies  Allergies as of 12/06/2016   No Known Allergies     Medication List    Notice   This visit is during an admission. Changes to the med list made in this visit will be reflected in the After Visit Summary of the admission.     Review of Systems  Immunization History  Administered Date(s) Administered  . Influenza,inj,Quad PF,36+ Mos 11/18/2016  . Pneumococcal Polysaccharide-23 11/18/2016   Pertinent  Health Maintenance Due  Topic Date Due  . DEXA SCAN  02/23/2003  . PNA vac Low Risk Adult (2 of 2 - PCV13) 11/18/2017  . INFLUENZA VACCINE  Completed   No flowsheet data found. Functional Status Survey:    There were no vitals filed for this visit. There is no height or weight on file to calculate BMI. Physical Exam  Labs reviewed:  Recent Labs  11/26/16 0840 11/28/16 0700 12/05/16 0700  NA 135 136 137  K 3.4* 3.2* 3.5  CL 99* 99* 100*  CO2 29 30 31   GLUCOSE 125* 95 83  BUN 15 14 10   CREATININE 0.95 0.85 0.72  CALCIUM 8.8* 8.2* 8.5*    Recent Labs  11/26/16 0840 12/05/16 0700  AST 23 35  ALT 17 59*  ALKPHOS 40 38  BILITOT 0.8 0.6  PROT 6.2* 5.5*  ALBUMIN 2.4* 2.2*    Recent Labs  11/11/16 1110  11/26/16 0840 11/28/16 0700 12/05/16 0700  WBC 13.7*  < > 13.0* 11.0* 5.9  NEUTROABS 12.3*  --  10.7* 8.9*  --   HGB 12.6  < > 12.2 10.7* 10.3*  HCT 38.6  < > 37.5 33.5* 32.5*  MCV 83.4  < > 81.7 82.3 82.5  PLT 401*  < > 279 228 202  < > = values in this interval not displayed. No results found for: TSH No results found for: HGBA1C No results found for: CHOL, HDL, LDLCALC, LDLDIRECT, TRIG, CHOLHDL  Significant Diagnostic Results in last 30 days:  Dg Chest 2 View  Result Date: 11/26/2016 CLINICAL DATA:  Recent hospitalization for pneumonia. Vomiting and fever last night. EXAM: CHEST  2 VIEW COMPARISON:  Radiographs 11/17/2016 and 11/20/2016.  CT 11/21/2016. FINDINGS:  There are persistent low lung volumes with asymmetric elevation of the left hemidiaphragm. The heart size and mediastinal contours are stable. There are persistent bilateral airspace opacities which are similar to the recent prior studies. No progressive airspace disease, significant pleural effusion or pneumothorax identified. The bones appear unchanged. Mild degenerative changes are present within the spine and both shoulders. IMPRESSION: No significant change in residual bilateral airspace opacities consistent with partially resolved pneumonia. No new findings seen. Electronically Signed   By: Carey Bullocks M.D.   On: 11/26/2016 09:48   Ct Chest Wo Contrast  Result Date: 11/21/2016 CLINICAL DATA:  81 year old female with shortness of breath for 3 weeks. Pneumonia. Subsequent encounter. EXAM: CT CHEST WITHOUT CONTRAST TECHNIQUE: Multidetector CT imaging of the chest was performed following the standard protocol without IV contrast. COMPARISON:  11/20/2016 and 12/17/2011 chest x-ray. No comparison chest CT. FINDINGS: Cardiovascular: Heart minimally deformed by a elevated left hemidiaphragm. No aneurysmal dilation of the thoracic aorta. Scattered atherosclerotic changes. Slight prominence pulmonary arteries. Mediastinum/Nodes: Normal size lymph nodes. Small hiatal hernia. Lungs/Pleura: Bilateral parenchymal changes greatest in the lung bases and left upper lobe. Findings suspicious for infectious infiltrates superimposed upon chronic changes/ bronchiectasis. Trachea and mainstem bronchi are patent. Mild pulmonary vascular congestion. Elevated left hemidiaphragm. Upper Abdomen: Mild hyperplasia adrenal glands. Scarring inferior aspect left kidney. Musculoskeletal: No destructive lesion or compression fracture. IMPRESSION: Bilateral parenchymal changes greatest in the lung bases and left upper lobe. Findings suspicious for infectious infiltrates superimposed upon chronic changes/ bronchiectasis. Mild pulmonary  vascular congestion. Elevated left hemidiaphragm. Electronically Signed   By: Lacy Duverney M.D.   On: 11/21/2016 18:30   Dg Chest Port 1 View  Result Date: 11/20/2016 CLINICAL DATA:  Shortness of breath for 3 weeks. EXAM: PORTABLE CHEST 1 VIEW COMPARISON:  11/17/2016 and prior exams FINDINGS: Diffuse bilateral airspace opacities are stable to slightly increased from the prior study. Cardiomediastinal silhouette is unchanged. There is no evidence of pneumothorax or definite pleural effusion. No acute bony abnormalities are noted. IMPRESSION: Stable to slight increase in diffuse bilateral airspace disease. Electronically Signed   By: Harmon Pier M.D.   On: 11/20/2016 17:19   Dg Chest Port 1 View  Result Date: 11/17/2016 CLINICAL DATA:  Respiratory failure. EXAM: PORTABLE CHEST 1 VIEW COMPARISON:  11/11/2016. FINDINGS: Mediastinum is stable. Heart size stable. Persistent unchanged bilateral pulmonary infiltrates and/or edema. Low lung volumes. No prominent pleural effusion. No pneumothorax . Stable elevation left hemidiaphragm. Interposition of the colon left hemidiaphragm again noted. IMPRESSION: Persistent prominent bilateral pulmonary infiltrates and or edema. No significant change. Electronically Signed   By: Maisie Fus  Register   On: 11/17/2016 07:30   Dg Chest Portable 1 View  Result Date: 11/11/2016 CLINICAL DATA:  Shortness of  breath with cough; left-sided chest pain EXAM: PORTABLE CHEST 1 VIEW COMPARISON:  December 17, 2011 FINDINGS: There is diffuse interstitial edema with patchy consolidation in the right upper lobe. Heart is upper normal in size with pulmonary venous hypertension. No evident adenopathy. No bone lesions. Slight elevation of the left hemidiaphragm is stable. No pneumothorax. IMPRESSION: Findings indicative of congestive heart failure. Question alveolar edema versus superimposed pneumonia right upper lobe. Stable cardiac silhouette. There is pulmonary venous hypertension.  Electronically Signed   By: Bretta Bang III M.D.   On: 11/11/2016 11:27    Assessment/Plan There are no diagnoses linked to this encounter.   Family/ staff Communication:   Labs/tests ordered:

## 2016-12-06 NOTE — Progress Notes (Signed)
Location:  [facility]   Place of Service:  SNF [id number]) Provider: Einar Crow MD  No PCP Per Patient  Patient Care Team: No Pcp Per Patient as PCP - General (General Practice)  Extended Emergency Contact Information Primary Emergency Contact: [facility] Address: [address]          [place], [place] [address] [place] Home Phone: [phone] Mobile Phone: [phone] Relation: Daughter  Code Status:  Full code Goals of care: Advanced Directive information Advanced Directives 11/28/2016  Does Patient Have a Medical Advance Directive? Yes  Type of Advance Directive (No Data)  Does patient want to make changes to medical advance directive? No - Patient declined  Would patient like information on creating a medical advance directive? No - Patient declined  Pre-existing out of facility DNR order (yellow form or pink MOST form) -     Chief Complaint  Patient presents with  . Acute Visit    Pneumonia    HPI:  Pt is a 81 y.o. female seen today for an acute visit for Follow up on her Pneumonia. Patient with h/o Macular blindness and arthritis.  Patient was initially admitted after treatment for CHF and Pneumonia. In the facility she was also found to have influenza.  She was treated with 7 days of Levaquin and full course of Tamiflu. Her repeat Cxray shows persistent Right Lower lobe infiltrate. She is afebrile but continue to have productive sputum,SOB on exertion, Right sided pleuritic pain and weakness. She was also on Lasix in hospital but was not on it on discharge. Her weight actually is down to 205lbs from admission weight of 209 lbs. Her Chest Xray did not show any Pulmonary Congestion. Her appetites is improving. And she is working with therapist.   Past Medical History:  Diagnosis Date  . Arthritis   . Blind   . Complication of anesthesia   . Kidney stones   . Macular degeneration   . PONV (postoperative nausea and vomiting)   .  Varicose vein of leg    Past Surgical History:  Procedure Laterality Date  . CYSTOSCOPY W/ URETERAL STENT PLACEMENT  05/10/2011   Procedure: CYSTOSCOPY WITH STENT REPLACEMENT;  Surgeon: Ky Barban;  Location: [facility];  Service: Urology;  Laterality: Bilateral;  . CYSTOSCOPY/RETROGRADE/URETEROSCOPY  05/10/2011   Procedure: CYSTOSCOPY/RETROGRADE/URETEROSCOPY;  Surgeon: Ky Barban;  Location: [facility];  Service: Urology;  Laterality: Bilateral;  no stone on right side  . EYE SURGERY  50's   detached retina  . KNEE ARTHROSCOPY     Right, [facility]    No Known Allergies  Allergies as of 12/06/2016   No Known Allergies     Medication List    Notice   This visit is during an admission. Changes to the med list made in this visit will be reflected in the After Visit Summary of the admission.     Review of Systems  Constitutional: Positive for appetite change. Negative for chills, fatigue and fever.  HENT: Negative for congestion, postnasal drip and rhinorrhea.   Respiratory: Positive for cough and shortness of breath. Negative for apnea and chest tightness.   Cardiovascular: Positive for leg swelling. Negative for chest pain and palpitations.  Gastrointestinal: Negative for abdominal distention, abdominal pain, constipation, diarrhea and nausea.  Genitourinary: Negative for dysuria, frequency and urgency.  Musculoskeletal: Negative.   Skin: Negative.   Neurological: Positive for weakness. Negative for dizziness, light-headedness and headaches.    Immunization History  Administered Date(s) Administered  .  Influenza,inj,Quad PF,36+ Mos 11/18/2016  . Pneumococcal Polysaccharide-23 11/18/2016   Pertinent  Health Maintenance Due  Topic Date Due  . DEXA SCAN  02/23/2003  . PNA vac Low Risk Adult (2 of 2 - PCV13) 11/18/2017  . INFLUENZA VACCINE  Completed   No flowsheet data found. Functional Status Survey:    Vitals:   12/06/16 1935  BP: 102/66  Pulse: 98  Resp:  20  Temp: 97.4 F (36.3 C)   There is no height or weight on file to calculate BMI. Physical Exam  Constitutional: She is oriented to person, place, and time. She appears well-developed and well-nourished.  HENT:  Head: Normocephalic.  Neck: Neck supple.  Cardiovascular: Normal rate, regular rhythm and normal heart sounds.   Pulmonary/Chest: Effort normal.  Rales present Bilateral  Abdominal: Soft. Bowel sounds are normal. She exhibits no distension. There is no tenderness. There is no rebound.  Musculoskeletal:  Bilateral Mild  Edema in LE  Neurological: She is alert and oriented to person, place, and time.  Skin: Skin is warm and dry.  Psychiatric: She has a normal mood and affect. Her behavior is normal.    Labs reviewed:  Recent Labs  11/26/16 0840 11/28/16 0700 12/05/16 0700  NA 135 136 137  K 3.4* 3.2* 3.5  CL 99* 99* 100*  CO2 29 30 31   GLUCOSE 125* 95 83  BUN 15 14 10   CREATININE 0.95 0.85 0.72  CALCIUM 8.8* 8.2* 8.5*    Recent Labs  11/26/16 0840 12/05/16 0700  AST 23 35  ALT 17 59*  ALKPHOS 40 38  BILITOT 0.8 0.6  PROT 6.2* 5.5*  ALBUMIN 2.4* 2.2*    Recent Labs  11/11/16 1110  11/26/16 0840 11/28/16 0700 12/05/16 0700  WBC 13.7*  < > 13.0* 11.0* 5.9  NEUTROABS 12.3*  --  10.7* 8.9*  --   HGB 12.6  < > 12.2 10.7* 10.3*  HCT 38.6  < > 37.5 33.5* 32.5*  MCV 83.4  < > 81.7 82.3 82.5  PLT 401*  < > 279 228 202  < > = values in this interval not displayed. No results found for: TSH No results found for: HGBA1C No results found for: CHOL, HDL, LDLCALC, LDLDIRECT, TRIG, CHOLHDL  Significant Diagnostic Results in last 30 days:  Dg Chest 2 View  Result Date: 11/26/2016 CLINICAL DATA:  Recent hospitalization for pneumonia. Vomiting and fever last night. EXAM: CHEST  2 VIEW COMPARISON:  Radiographs 11/17/2016 and 11/20/2016.  CT 11/21/2016. FINDINGS: There are persistent low lung volumes with asymmetric elevation of the left hemidiaphragm. The heart  size and mediastinal contours are stable. There are persistent bilateral airspace opacities which are similar to the recent prior studies. No progressive airspace disease, significant pleural effusion or pneumothorax identified. The bones appear unchanged. Mild degenerative changes are present within the spine and both shoulders. IMPRESSION: No significant change in residual bilateral airspace opacities consistent with partially resolved pneumonia. No new findings seen. Electronically Signed   By: Carey BullocksWilliam  Veazey M.D.   On: 11/26/2016 09:48   Ct Chest Wo Contrast  Result Date: 11/21/2016 CLINICAL DATA:  81 year old female with shortness of breath for 3 weeks. Pneumonia. Subsequent encounter. EXAM: CT CHEST WITHOUT CONTRAST TECHNIQUE: Multidetector CT imaging of the chest was performed following the standard protocol without IV contrast. COMPARISON:  11/20/2016 and 12/17/2011 chest x-ray. No comparison chest CT. FINDINGS: Cardiovascular: Heart minimally deformed by a elevated left hemidiaphragm. No aneurysmal dilation of the thoracic aorta. Scattered atherosclerotic changes.  Slight prominence pulmonary arteries. Mediastinum/Nodes: Normal size lymph nodes. Small hiatal hernia. Lungs/Pleura: Bilateral parenchymal changes greatest in the lung bases and left upper lobe. Findings suspicious for infectious infiltrates superimposed upon chronic changes/ bronchiectasis. Trachea and mainstem bronchi are patent. Mild pulmonary vascular congestion. Elevated left hemidiaphragm. Upper Abdomen: Mild hyperplasia adrenal glands. Scarring inferior aspect left kidney. Musculoskeletal: No destructive lesion or compression fracture. IMPRESSION: Bilateral parenchymal changes greatest in the lung bases and left upper lobe. Findings suspicious for infectious infiltrates superimposed upon chronic changes/ bronchiectasis. Mild pulmonary vascular congestion. Elevated left hemidiaphragm. Electronically Signed   By: Lacy Duverney M.D.   On:  11/21/2016 18:30   Dg Chest Port 1 View  Result Date: 11/20/2016 CLINICAL DATA:  Shortness of breath for 3 weeks. EXAM: PORTABLE CHEST 1 VIEW COMPARISON:  11/17/2016 and prior exams FINDINGS: Diffuse bilateral airspace opacities are stable to slightly increased from the prior study. Cardiomediastinal silhouette is unchanged. There is no evidence of pneumothorax or definite pleural effusion. No acute bony abnormalities are noted. IMPRESSION: Stable to slight increase in diffuse bilateral airspace disease. Electronically Signed   By: Harmon Pier M.D.   On: 11/20/2016 17:19   Dg Chest Port 1 View  Result Date: 11/17/2016 CLINICAL DATA:  Respiratory failure. EXAM: PORTABLE CHEST 1 VIEW COMPARISON:  11/11/2016. FINDINGS: Mediastinum is stable. Heart size stable. Persistent unchanged bilateral pulmonary infiltrates and/or edema. Low lung volumes. No prominent pleural effusion. No pneumothorax . Stable elevation left hemidiaphragm. Interposition of the colon left hemidiaphragm again noted. IMPRESSION: Persistent prominent bilateral pulmonary infiltrates and or edema. No significant change. Electronically Signed   By: Maisie Fus  Register   On: 11/17/2016 07:30   Dg Chest Portable 1 View  Result Date: 11/11/2016 CLINICAL DATA:  Shortness of breath with cough; left-sided chest pain EXAM: PORTABLE CHEST 1 VIEW COMPARISON:  December 17, 2011 FINDINGS: There is diffuse interstitial edema with patchy consolidation in the right upper lobe. Heart is upper normal in size with pulmonary venous hypertension. No evident adenopathy. No bone lesions. Slight elevation of the left hemidiaphragm is stable. No pneumothorax. IMPRESSION: Findings indicative of congestive heart failure. Question alveolar edema versus superimposed pneumonia right upper lobe. Stable cardiac silhouette. There is pulmonary venous hypertension. Electronically Signed   By: Bretta Bang III M.D.   On: 11/11/2016 11:27    Assessment/Plan  Community  Acquired Pneumonia Patient continues to have infiltrate in Repeat Xray. And Marylen Ponto is still symptomatic. Will restart Levaquin for 7 more days. Continue O2 supplement Also on Mucinex and Tessalon. Influenza Patient finished course of Tamiflu CHF pateint has not required any more Lasix in the facility. Her weight is actually down. Just has mild LE edema. Will follow right now. Anemia Hgb stable  Family/ staff Communication:   Labs/tests ordered:

## 2016-12-12 ENCOUNTER — Encounter (HOSPITAL_COMMUNITY)
Admission: RE | Admit: 2016-12-12 | Discharge: 2016-12-12 | Disposition: A | Discharge status: Home / Self Care | Payer: Medicare Other | Source: Skilled Nursing Facility | Attending: Internal Medicine | Admitting: Internal Medicine

## 2016-12-12 DIAGNOSIS — J101 Influenza due to other identified influenza virus with other respiratory manifestations: Secondary | ICD-10-CM | POA: Insufficient documentation

## 2016-12-12 DIAGNOSIS — M6281 Muscle weakness (generalized): Secondary | ICD-10-CM | POA: Insufficient documentation

## 2016-12-12 DIAGNOSIS — J189 Pneumonia, unspecified organism: Secondary | ICD-10-CM | POA: Insufficient documentation

## 2016-12-12 DIAGNOSIS — J9691 Respiratory failure, unspecified with hypoxia: Secondary | ICD-10-CM | POA: Insufficient documentation

## 2016-12-12 LAB — CBC WITH DIFFERENTIAL/PLATELET
BASOS ABS: 0.1 10*3/uL (ref 0.0–0.1)
Basophils Relative: 1 %
EOS PCT: 7 %
Eosinophils Absolute: 0.3 10*3/uL (ref 0.0–0.7)
HCT: 32.1 % — ABNORMAL LOW (ref 36.0–46.0)
Hemoglobin: 10.3 g/dL — ABNORMAL LOW (ref 12.0–15.0)
LYMPHS PCT: 18 %
Lymphs Abs: 0.7 10*3/uL (ref 0.7–4.0)
MCH: 26.5 pg (ref 26.0–34.0)
MCHC: 32.1 g/dL (ref 30.0–36.0)
MCV: 82.7 fL (ref 78.0–100.0)
MONO ABS: 0.7 10*3/uL (ref 0.1–1.0)
MONOS PCT: 16 %
Neutro Abs: 2.4 10*3/uL (ref 1.7–7.7)
Neutrophils Relative %: 58 %
Platelets: 183 10*3/uL (ref 150–400)
RBC: 3.88 MIL/uL (ref 3.87–5.11)
RDW: 16.4 % — AB (ref 11.5–15.5)
WBC: 4.2 10*3/uL (ref 4.0–10.5)

## 2016-12-12 LAB — BASIC METABOLIC PANEL
Anion gap: 7 (ref 5–15)
BUN: 8 mg/dL (ref 6–20)
CO2: 31 mmol/L (ref 22–32)
Calcium: 8.6 mg/dL — ABNORMAL LOW (ref 8.9–10.3)
Chloride: 99 mmol/L — ABNORMAL LOW (ref 101–111)
Creatinine, Ser: 0.77 mg/dL (ref 0.44–1.00)
GFR calc Af Amer: >60 mL/min (ref >60)
GLUCOSE: 89 mg/dL (ref 65–99)
POTASSIUM: 3.6 mmol/L (ref 3.5–5.1)
Sodium: 137 mmol/L (ref 135–145)

## 2016-12-30 ENCOUNTER — Non-Acute Institutional Stay (SKILLED_NURSING_FACILITY): Payer: Medicare Other | Admitting: Internal Medicine

## 2016-12-30 ENCOUNTER — Encounter: Payer: Self-pay | Admitting: Internal Medicine

## 2016-12-30 DIAGNOSIS — M1990 Unspecified osteoarthritis, unspecified site: Secondary | ICD-10-CM

## 2016-12-30 DIAGNOSIS — J189 Pneumonia, unspecified organism: Secondary | ICD-10-CM | POA: Diagnosis not present

## 2016-12-30 DIAGNOSIS — I5031 Acute diastolic (congestive) heart failure: Secondary | ICD-10-CM

## 2016-12-30 DIAGNOSIS — J111 Influenza due to unidentified influenza virus with other respiratory manifestations: Secondary | ICD-10-CM

## 2016-12-30 NOTE — Progress Notes (Signed)
Location:   [facility] Room Number: 124/P Place of Service:  SNF (31)  Provider: Arlo,Lassen  PCP: No PCP Per Patient Patient Care Team: No Pcp Per Patient as PCP - General (General Practice)  Extended Emergency Contact Information Primary Emergency Contact: [facility] Address: [address]          [place], [place] [address] [place] Home Phone: [phone] Mobile Phone: [phone] Relation: Daughter  Code Status: Full Code Goals of care:  Advanced Directive information Advanced Directives 12/30/2016  Does Patient Have a Medical Advance Directive? Yes  Type of Advance Directive (No Data)  Does patient want to make changes to medical advance directive? No - Patient declined  Would patient like information on creating a medical advance directive? No - Patient declined  Pre-existing out of facility DNR order (yellow form or pink MOST form) -     No Known Allergies  Chief Complaint  Patient presents with  . Discharge Note    HPI:  81 y.o. female  for discharge home.  She was here for rehabilitation after hospitalization for a pulmonary edema as well as pneumonia.  She was treated with IV antibiotics and IV Lasix and improved.  However when she came here she was found also have nausea with vomiting and some influenza.  She was started on Tamiflu and responded quite well to this.  She continued to complain somewhat of a cough congestion--repeat chest x-ray showed a persistent right lower lobe infiltrate-but no pulmonary edema.  Dr. Chales Abrahams did extend her course of Levaquin-she says she is doing significant better now does not report shortness of breath any increased cough or congestion she is looking forward to going home.  She still is requiring oxygen will need to go home on this appears.  Her other medical issues appear to be stable she does have a history of legal blindness with macular degeneration-she has adapted to  this appears quite well and has been fairly independent at home she assures me she is comfortable going home in this regards.  She also has a history of arthritis which appears to be controlled on Tylenol.  Regards to CHF she's not on a diuretic but her weights have been stable.  I note at times blood pressures are systolic in the 90s-I got 112/78 today this only appears to be sporadic the blood pressures systolically less than 100 talking her she says this is nothing new in that she is asymptomatic no dizziness or syncope.  Currently she is ambulating 20 feet with a rolling walker-says she will largely use her wheelchair at home.  On need continued PT and OT for strengthening as well as nursing support and his CNA to help with her activities of daily living her daughter does live nearby and is quite involved.  Again will need oxygen as well as a wheelchair    Past Medical History:  Diagnosis Date  . Arthritis   . Blind   . Complication of anesthesia   . Kidney stones   . Macular degeneration   . PONV (postoperative nausea and vomiting)   . Varicose vein of leg     Past Surgical History:  Procedure Laterality Date  . CYSTOSCOPY W/ URETERAL STENT PLACEMENT  05/10/2011   Procedure: CYSTOSCOPY WITH STENT REPLACEMENT;  Surgeon: Ky Barban;  Location: [facility];  Service: Urology;  Laterality: Bilateral;  . CYSTOSCOPY/RETROGRADE/URETEROSCOPY  05/10/2011   Procedure: CYSTOSCOPY/RETROGRADE/URETEROSCOPY;  Surgeon: Ky Barban;  Location: [facility];  Service: Urology;  Laterality: Bilateral;  no stone on right side  . EYE SURGERY  50's   detached retina  . KNEE ARTHROSCOPY     Right, [facility]      reports that she has quit smoking. She has never used smokeless tobacco. She reports that she does not drink alcohol or use drugs. Social History   Social History  . Marital status: Widowed    Spouse name: N/A  . Number of children: N/A  . Years of education: N/A    Occupational History  . Not on file.   Social History Main Topics  . Smoking status: Former Games developermoker  . Smokeless tobacco: Never Used  . Alcohol use No  . Drug use: No  . Sexual activity: Not on file   Other Topics Concern  . Not on file   Social History Narrative  . No narrative on file   Functional Status Survey:    No Known Allergies  Pertinent  Health Maintenance Due  Topic Date Due  . DEXA SCAN  02/23/2003  . PNA vac Low Risk Adult (2 of 2 - PCV13) 11/18/2017  . INFLUENZA VACCINE  Completed    Medications: Current Outpatient Prescriptions on File Prior to Visit  Medication Sig Dispense Refill  . acetaminophen (TYLENOL) 325 MG tablet Take 650 mg by mouth every 4 (four) hours as needed.    Marland Kitchen. aspirin EC 81 MG tablet Take 81 mg by mouth daily.    . OXYGEN Oxygen @@ 2 L /mn via every q shift     No current facility-administered medications on file prior to visit.      Review of Systems  In general does not complaining of fever or chills says she feels well. Says she still doesn't have a very good appetite  Her skin does not complain of rashes or itching  Head ears eyes nose mouth and throat does have a history of blindness with macular degeneration-does not complain of sore throat and 1 or nasal congestion.  Respiratory denies shortness breath or cough currently she is oxygen dependent.  Cardiac does not complain of chest pain does have it appears chronic lower extremity edema.  GI does not complain nausea vomiting diarrhea constipation or abdominal pain.  GU does not complain of dysuria.  Muscle skeletal is not complaining currently of joint pain.  Neurologic is not complaining of dizziness or headache still has some weakness but has gotten stronger   Vitals:   12/30/16 1039  BP: 107/72  Pulse: 89  Resp: 20  Temp: 98.3 F (36.8 C)  TempSrc: Oral  SpO2: 94%  Manual blood pressure today was 112/78  Physical Exam In general this is a very pleasant  elderly female in no distress sitting comfortably in her wheelchair.  Her skin is warm and dry.  Eyes she does have legal blindness she is able to open her left eye.  Oropharynx clear mucous membranes moist.  Chest is clear to auscultation there is no labored breathing.  Heart is regular rate and rhythm without murmur gallop or rub she has bilateral mild lower extremity edema pedal pulses are intact bilaterally.  Abdomen is soft obese nontender with positive bowel sounds.  Muscle skeletal does move all extremities 4 with some lower extremity weakness is ambulatory in a wheelchair.  Neurologic is grossly intact her speech is clear no lateralizing findings.  Psych she is alert and oriented very pleasant and appropriate Labs reviewed: Basic Metabolic Panel:  Recent Labs  82/95/6201/03/10 0700  12/05/16 0700 12/12/16 0727  NA 136 137 137  K 3.2* 3.5 3.6  CL 99* 100* 99*  CO2 30 31 31   GLUCOSE 95 83 89  BUN 14 10 8   CREATININE 0.85 0.72 0.77  CALCIUM 8.2* 8.5* 8.6*   Liver Function Tests:  Recent Labs  11/26/16 0840 12/05/16 0700  AST 23 35  ALT 17 59*  ALKPHOS 40 38  BILITOT 0.8 0.6  PROT 6.2* 5.5*  ALBUMIN 2.4* 2.2*    Recent Labs  11/26/16 0840  LIPASE 15   No results for input(s): AMMONIA in the last 8760 hours. CBC:  Recent Labs  11/26/16 0840 11/28/16 0700 12/05/16 0700 12/12/16 0727  WBC 13.0* 11.0* 5.9 4.2  NEUTROABS 10.7* 8.9*  --  2.4  HGB 12.2 10.7* 10.3* 10.3*  HCT 37.5 33.5* 32.5* 32.1*  MCV 81.7 82.3 82.5 82.7  PLT 279 228 202 183   Cardiac Enzymes:  Recent Labs  11/11/16 1110  TROPONINI <0.03   BNP: Invalid input(s): POCBNP CBG: No results for input(s): GLUCAP in the last 8760 hours.  Procedures and Imaging Studies During Stay: No results found.  Assessment/Plan:   #1 history of pneumonia-she is completed 2 courses Levaquin says she is feeling sniffily better-this appears to have stabilized does not complain of increased cough  or congestion remains on oxygen will need be discharged home on this she does desaturate when it is taken off.  At this point will need follow-up by primary care provider also will need continued PT and OT for strengthening as well as nursing support CNA to help with her activities of daily living.  #2 history of influenza A she was treated it appears successfully with Tamiflu currently asymptomatic.  #3 history CHF currently off diuretics nonetheless her weights appear to be stable respiratory status appears stable this will need to be watched closely as an outpatient.  #4 history of blindness again this is not new and she has adopted appears fairly well and says she does well at home she would benefit however from nursing supportive loss CNA support she does have a daughter who is very close by as well.  #5 history of anemia this is been stable hemoglobin was 10.3 back on 12/12/2016 will update this before discharge.  #6 history of arthritis this appears stable on Tylenol.  #7 history of occasional systolic blood pressures in the 90s apparently this is not new she says she is asymptomatic she is not on any blood pressure is will warrant follow-up by primary care provider but this appears to be somewhat of a chronic situation and intermitted that has  been asymptomatic .   Patient is being discharged with the following home health services:   will need a wheelchair for ambulation and weakness as well as oxygen secondary to desaturation often  Patient is being discharged with the following durable medical equipment:  will need continued PT and OT for strengthening as well as nursing support for multiple medical issues in CNA support to help with ADLs   Patient has been advised to f/u with their PCP in 1-2 weeks to bring them up to date on their rehab stay.  Social services at facility was responsible for arranging this appointment.  Pt was provided with a 30 day supply of prescriptions for  medications and refills must be obtained from their PCP.  For controlled substances, a more limited supply may be provided adequate until PCP appointment only.  Future labs/tests needed:  will order CBC  BMP before discharge with follow-up as needed by primary care physician   [phone] note greater than 30 minutes been on this discharge summary-greater than 50% of time spent coordinating of care for numerous diagnoses

## 2016-12-31 ENCOUNTER — Encounter (HOSPITAL_COMMUNITY)
Admission: RE | Admit: 2016-12-31 | Discharge: 2016-12-31 | Disposition: A | Discharge status: Home / Self Care | Payer: Medicare Other | Source: Skilled Nursing Facility | Attending: Pediatrics | Admitting: Pediatrics

## 2016-12-31 DIAGNOSIS — I5032 Chronic diastolic (congestive) heart failure: Secondary | ICD-10-CM | POA: Insufficient documentation

## 2016-12-31 DIAGNOSIS — J101 Influenza due to other identified influenza virus with other respiratory manifestations: Secondary | ICD-10-CM | POA: Insufficient documentation

## 2016-12-31 LAB — CBC WITH DIFFERENTIAL/PLATELET
BASOS PCT: 1 %
Basophils Absolute: 0 10*3/uL (ref 0.0–0.1)
EOS ABS: 0.2 10*3/uL (ref 0.0–0.7)
EOS PCT: 4 %
HCT: 29.8 % — ABNORMAL LOW (ref 36.0–46.0)
Hemoglobin: 9.5 g/dL — ABNORMAL LOW (ref 12.0–15.0)
Lymphocytes Relative: 19 %
Lymphs Abs: 1.1 10*3/uL (ref 0.7–4.0)
MCH: 26.2 pg (ref 26.0–34.0)
MCHC: 31.9 g/dL (ref 30.0–36.0)
MCV: 82.1 fL (ref 78.0–100.0)
MONO ABS: 0.6 10*3/uL (ref 0.1–1.0)
MONOS PCT: 10 %
NEUTROS PCT: 66 %
Neutro Abs: 3.8 10*3/uL (ref 1.7–7.7)
Platelets: 251 10*3/uL (ref 150–400)
RBC: 3.63 MIL/uL — ABNORMAL LOW (ref 3.87–5.11)
RDW: 17.9 % — ABNORMAL HIGH (ref 11.5–15.5)
WBC: 5.8 10*3/uL (ref 4.0–10.5)

## 2016-12-31 LAB — BASIC METABOLIC PANEL
Anion gap: 6 (ref 5–15)
BUN: 12 mg/dL (ref 6–20)
CALCIUM: 8.7 mg/dL — AB (ref 8.9–10.3)
CO2: 29 mmol/L (ref 22–32)
CREATININE: 0.72 mg/dL (ref 0.44–1.00)
Chloride: 103 mmol/L (ref 101–111)
GFR calc non Af Amer: >60 mL/min (ref >60)
GLUCOSE: 91 mg/dL (ref 65–99)
Potassium: 3.5 mmol/L (ref 3.5–5.1)
Sodium: 138 mmol/L (ref 135–145)

## 2017-01-28 ENCOUNTER — Other Ambulatory Visit (HOSPITAL_COMMUNITY)
Admission: RE | Admit: 2017-01-28 | Discharge: 2017-01-28 | Disposition: A | Discharge status: Home / Self Care | Payer: Medicare Other | Source: Other Acute Inpatient Hospital | Attending: Internal Medicine | Admitting: Internal Medicine

## 2017-01-28 DIAGNOSIS — I5021 Acute systolic (congestive) heart failure: Secondary | ICD-10-CM | POA: Insufficient documentation

## 2017-01-28 LAB — COMPREHENSIVE METABOLIC PANEL
ALBUMIN: 2.6 g/dL — AB (ref 3.5–5.0)
ALK PHOS: 43 U/L (ref 38–126)
ALT: 17 U/L (ref 14–54)
AST: 31 U/L (ref 15–41)
Anion gap: 8 (ref 5–15)
BUN: 13 mg/dL (ref 6–20)
CALCIUM: 8.7 mg/dL — AB (ref 8.9–10.3)
CO2: 29 mmol/L (ref 22–32)
CREATININE: 0.88 mg/dL (ref 0.44–1.00)
Chloride: 94 mmol/L — ABNORMAL LOW (ref 101–111)
GFR calc non Af Amer: >60 mL/min (ref >60)
GLUCOSE: 96 mg/dL (ref 65–99)
Potassium: 3.1 mmol/L — ABNORMAL LOW (ref 3.5–5.1)
SODIUM: 131 mmol/L — AB (ref 135–145)
TOTAL PROTEIN: 6.3 g/dL — AB (ref 6.5–8.1)
Total Bilirubin: 0.8 mg/dL (ref 0.3–1.2)

## 2017-01-28 LAB — CBC WITH DIFFERENTIAL/PLATELET
Basophils Absolute: 0 10*3/uL (ref 0.0–0.1)
Basophils Relative: 1 %
EOS ABS: 0.6 10*3/uL (ref 0.0–0.7)
EOS PCT: 7 %
HCT: 35.8 % — ABNORMAL LOW (ref 36.0–46.0)
Hemoglobin: 11.6 g/dL — ABNORMAL LOW (ref 12.0–15.0)
LYMPHS ABS: 1.1 10*3/uL (ref 0.7–4.0)
LYMPHS PCT: 14 %
MCH: 26.6 pg (ref 26.0–34.0)
MCHC: 32.4 g/dL (ref 30.0–36.0)
MCV: 82.1 fL (ref 78.0–100.0)
MONOS PCT: 6 %
Monocytes Absolute: 0.5 10*3/uL (ref 0.1–1.0)
Neutro Abs: 5.7 10*3/uL (ref 1.7–7.7)
Neutrophils Relative %: 72 %
Platelets: 203 10*3/uL (ref 150–400)
RBC: 4.36 MIL/uL (ref 3.87–5.11)
RDW: 18.9 % — ABNORMAL HIGH (ref 11.5–15.5)
WBC: 8 10*3/uL (ref 4.0–10.5)

## 2017-02-01 ENCOUNTER — Other Ambulatory Visit (HOSPITAL_COMMUNITY): Payer: Self-pay | Admitting: Internal Medicine

## 2017-02-01 ENCOUNTER — Ambulatory Visit (HOSPITAL_COMMUNITY)
Admission: RE | Admit: 2017-02-01 | Discharge: 2017-02-01 | Disposition: A | Discharge status: Home / Self Care | Payer: Medicare Other | Source: Ambulatory Visit | Attending: Internal Medicine | Admitting: Internal Medicine

## 2017-02-01 DIAGNOSIS — J159 Unspecified bacterial pneumonia: Secondary | ICD-10-CM

## 2017-02-01 DIAGNOSIS — J984 Other disorders of lung: Secondary | ICD-10-CM | POA: Insufficient documentation

## 2017-02-01 DIAGNOSIS — J189 Pneumonia, unspecified organism: Secondary | ICD-10-CM | POA: Diagnosis present

## 2017-02-02 ENCOUNTER — Other Ambulatory Visit (HOSPITAL_COMMUNITY): Payer: Self-pay | Admitting: Pulmonary Disease

## 2017-02-02 DIAGNOSIS — R0602 Shortness of breath: Secondary | ICD-10-CM

## 2017-02-06 ENCOUNTER — Ambulatory Visit (HOSPITAL_COMMUNITY)
Admission: RE | Admit: 2017-02-06 | Discharge: 2017-02-06 | Disposition: A | Discharge status: Home / Self Care | Payer: Medicare Other | Source: Ambulatory Visit | Attending: Pulmonary Disease | Admitting: Pulmonary Disease

## 2017-02-06 DIAGNOSIS — I2699 Other pulmonary embolism without acute cor pulmonale: Secondary | ICD-10-CM | POA: Diagnosis not present

## 2017-02-06 DIAGNOSIS — R0602 Shortness of breath: Secondary | ICD-10-CM | POA: Diagnosis present

## 2017-02-06 DIAGNOSIS — J9811 Atelectasis: Secondary | ICD-10-CM | POA: Insufficient documentation

## 2017-02-06 MED ORDER — IOPAMIDOL (ISOVUE-370) INJECTION 76%
100.0000 mL | Freq: Once | INTRAVENOUS | Status: AC | PRN
  Administered 2017-02-06: 100 mL via INTRAVENOUS

## 2017-02-17 ENCOUNTER — Ambulatory Visit (HOSPITAL_COMMUNITY): Payer: Medicare Other

## 2017-04-21 ENCOUNTER — Inpatient Hospital Stay (HOSPITAL_COMMUNITY)
Admission: AD | Admit: 2017-04-21 | Discharge: 2017-04-26 | DRG: 291 | Disposition: A | Discharge status: Skilled Nursing Facility | Payer: Medicare Other | Source: Ambulatory Visit | Attending: Pulmonary Disease | Admitting: Pulmonary Disease

## 2017-04-21 ENCOUNTER — Inpatient Hospital Stay (HOSPITAL_COMMUNITY): Payer: Medicare Other

## 2017-04-21 ENCOUNTER — Encounter (HOSPITAL_COMMUNITY): Payer: Self-pay | Admitting: *Deleted

## 2017-04-21 DIAGNOSIS — J449 Chronic obstructive pulmonary disease, unspecified: Secondary | ICD-10-CM | POA: Diagnosis present

## 2017-04-21 DIAGNOSIS — I5033 Acute on chronic diastolic (congestive) heart failure: Secondary | ICD-10-CM | POA: Diagnosis present

## 2017-04-21 DIAGNOSIS — I509 Heart failure, unspecified: Secondary | ICD-10-CM

## 2017-04-21 DIAGNOSIS — I8390 Asymptomatic varicose veins of unspecified lower extremity: Secondary | ICD-10-CM | POA: Diagnosis present

## 2017-04-21 DIAGNOSIS — M1990 Unspecified osteoarthritis, unspecified site: Secondary | ICD-10-CM | POA: Diagnosis present

## 2017-04-21 DIAGNOSIS — J9621 Acute and chronic respiratory failure with hypoxia: Secondary | ICD-10-CM | POA: Diagnosis present

## 2017-04-21 DIAGNOSIS — J9601 Acute respiratory failure with hypoxia: Secondary | ICD-10-CM | POA: Diagnosis present

## 2017-04-21 DIAGNOSIS — H543 Unqualified visual loss, both eyes: Secondary | ICD-10-CM | POA: Diagnosis present

## 2017-04-21 DIAGNOSIS — Z87891 Personal history of nicotine dependence: Secondary | ICD-10-CM | POA: Diagnosis not present

## 2017-04-21 DIAGNOSIS — M7989 Other specified soft tissue disorders: Secondary | ICD-10-CM | POA: Diagnosis present

## 2017-04-21 DIAGNOSIS — Z79899 Other long term (current) drug therapy: Secondary | ICD-10-CM

## 2017-04-21 DIAGNOSIS — H3530 Unspecified macular degeneration: Secondary | ICD-10-CM | POA: Diagnosis present

## 2017-04-21 DIAGNOSIS — Z7901 Long term (current) use of anticoagulants: Secondary | ICD-10-CM | POA: Diagnosis not present

## 2017-04-21 DIAGNOSIS — Z86711 Personal history of pulmonary embolism: Secondary | ICD-10-CM | POA: Diagnosis present

## 2017-04-21 LAB — CBC WITH DIFFERENTIAL/PLATELET
BASOS ABS: 0 10*3/uL (ref 0.0–0.1)
Basophils Relative: 0 %
EOS PCT: 0 %
Eosinophils Absolute: 0 10*3/uL (ref 0.0–0.7)
HEMATOCRIT: 30.6 % — AB (ref 36.0–46.0)
Hemoglobin: 10.4 g/dL — ABNORMAL LOW (ref 12.0–15.0)
LYMPHS PCT: 7 %
Lymphs Abs: 0.6 10*3/uL — ABNORMAL LOW (ref 0.7–4.0)
MCH: 28.1 pg (ref 26.0–34.0)
MCHC: 34 g/dL (ref 30.0–36.0)
MCV: 82.7 fL (ref 78.0–100.0)
MONO ABS: 0.8 10*3/uL (ref 0.1–1.0)
Monocytes Relative: 10 %
NEUTROS ABS: 6.5 10*3/uL (ref 1.7–7.7)
Neutrophils Relative %: 83 %
PLATELETS: 261 10*3/uL (ref 150–400)
RBC: 3.7 MIL/uL — ABNORMAL LOW (ref 3.87–5.11)
RDW: 19.6 % — AB (ref 11.5–15.5)
WBC: 7.9 10*3/uL (ref 4.0–10.5)

## 2017-04-21 LAB — ECHOCARDIOGRAM COMPLETE
AVLVOTPG: 7 mmHg
CHL CUP DOP CALC LVOT VTI: 23.7 cm
CHL CUP STROKE VOLUME: 37 mL
CHL CUP TV REG PEAK VELOCITY: 258 cm/s
E decel time: 597 msec
E/e' ratio: 3.66
FS: 38 % (ref 28–44)
IV/PV OW: 0.96
LA diam end sys: 28 mm
LA diam index: 1.29 cm/m2
LA vol index: 17.5 mL/m2
LA vol: 38 mL
LASIZE: 28 mm
LAVOLA4C: 40.6 mL
LDCA: 2.54 cm2
LV E/e' medial: 3.66
LV E/e'average: 3.66
LV PW d: 11.6 mm — AB (ref 0.6–1.1)
LV TDI E'LATERAL: 12.9
LV e' LATERAL: 12.9 cm/s
LVDIAVOL: 60 mL (ref 46–106)
LVDIAVOLIN: 28 mL/m2
LVOT SV: 60 mL
LVOTD: 18 mm
LVOTPV: 133 cm/s
LVSYSVOL: 23 mL (ref 14–42)
LVSYSVOLIN: 11 mL/m2
MV Dec: 597
MV pk E vel: 47.2 m/s
MVPKAVEL: 101 m/s
RV LATERAL S' VELOCITY: 10.1 cm/s
RV TAPSE: 15.7 mm
RV sys press: 35 mmHg
Simpson's disk: 62
TDI e' medial: 8.05
TR max vel: 258 cm/s

## 2017-04-21 LAB — URINALYSIS, ROUTINE W REFLEX MICROSCOPIC
BILIRUBIN URINE: NEGATIVE
GLUCOSE, UA: NEGATIVE mg/dL
KETONES UR: NEGATIVE mg/dL
LEUKOCYTES UA: NEGATIVE
NITRITE: NEGATIVE
PH: 5 (ref 5.0–8.0)
PROTEIN: NEGATIVE mg/dL
Specific Gravity, Urine: 1.008 (ref 1.005–1.030)

## 2017-04-21 LAB — COMPREHENSIVE METABOLIC PANEL
ALBUMIN: 2 g/dL — AB (ref 3.5–5.0)
ALT: 35 U/L (ref 14–54)
ANION GAP: 8 (ref 5–15)
AST: 56 U/L — AB (ref 15–41)
Alkaline Phosphatase: 38 U/L (ref 38–126)
BILIRUBIN TOTAL: 0.9 mg/dL (ref 0.3–1.2)
BUN: 20 mg/dL (ref 6–20)
CHLORIDE: 98 mmol/L — AB (ref 101–111)
CO2: 26 mmol/L (ref 22–32)
Calcium: 8.2 mg/dL — ABNORMAL LOW (ref 8.9–10.3)
Creatinine, Ser: 1.37 mg/dL — ABNORMAL HIGH (ref 0.44–1.00)
GFR calc Af Amer: 41 mL/min — ABNORMAL LOW (ref >60)
GFR calc non Af Amer: 36 mL/min — ABNORMAL LOW (ref >60)
GLUCOSE: 109 mg/dL — AB (ref 65–99)
POTASSIUM: 3.6 mmol/L (ref 3.5–5.1)
Sodium: 132 mmol/L — ABNORMAL LOW (ref 135–145)
TOTAL PROTEIN: 5.8 g/dL — AB (ref 6.5–8.1)

## 2017-04-21 LAB — BRAIN NATRIURETIC PEPTIDE: B Natriuretic Peptide: 52 pg/mL (ref 0.0–100.0)

## 2017-04-21 MED ORDER — POLYETHYLENE GLYCOL 3350 17 G PO PACK: 17.0000 g | PACK | Freq: Every day | ORAL | Status: DC | PRN

## 2017-04-21 MED ORDER — FUROSEMIDE 10 MG/ML IJ SOLN
40.0000 mg | Freq: Two times a day (BID) | INTRAMUSCULAR | Status: DC
Start: 2017-04-21 — End: 2017-04-26
  Administered 2017-04-21 – 2017-04-26 (×10): 40 mg via INTRAVENOUS
  Filled 2017-04-21 (×10): qty 4

## 2017-04-21 MED ORDER — SODIUM CHLORIDE 0.9% FLUSH: 3.0000 mL | INTRAVENOUS | Status: DC | PRN

## 2017-04-21 MED ORDER — ACETAMINOPHEN 325 MG PO TABS
650.0000 mg | ORAL_TABLET | Freq: Four times a day (QID) | ORAL | Status: DC | PRN
  Administered 2017-04-21 – 2017-04-23 (×3): 650 mg via ORAL
  Filled 2017-04-21 (×3): qty 2

## 2017-04-21 MED ORDER — ACETAMINOPHEN 650 MG RE SUPP: 650.0000 mg | Freq: Four times a day (QID) | RECTAL | Status: DC | PRN

## 2017-04-21 MED ORDER — POTASSIUM CHLORIDE CRYS ER 20 MEQ PO TBCR
20.0000 meq | EXTENDED_RELEASE_TABLET | Freq: Two times a day (BID) | ORAL | Status: DC
  Administered 2017-04-21 – 2017-04-26 (×11): 20 meq via ORAL
  Filled 2017-04-21 (×11): qty 1

## 2017-04-21 MED ORDER — SODIUM CHLORIDE 0.9 % IV SOLN: 250.0000 mL | INTRAVENOUS | Status: DC | PRN

## 2017-04-21 MED ORDER — SODIUM CHLORIDE 0.9% FLUSH
3.0000 mL | Freq: Two times a day (BID) | INTRAVENOUS | Status: DC
  Administered 2017-04-21 – 2017-04-26 (×11): 3 mL via INTRAVENOUS

## 2017-04-21 NOTE — Progress Notes (Signed)
This an approx time for assessment.

## 2017-04-21 NOTE — Progress Notes (Signed)
*  PRELIMINARY RESULTS* Echocardiogram 2D Echocardiogram has been performed.  Stacey DrainWhite, Gerrell Tabet J 04/21/2017, 3:14 PM

## 2017-04-22 LAB — BASIC METABOLIC PANEL
Anion gap: 6 (ref 5–15)
BUN: 20 mg/dL (ref 6–20)
CHLORIDE: 98 mmol/L — AB (ref 101–111)
CO2: 27 mmol/L (ref 22–32)
Calcium: 7.8 mg/dL — ABNORMAL LOW (ref 8.9–10.3)
Creatinine, Ser: 1.37 mg/dL — ABNORMAL HIGH (ref 0.44–1.00)
GFR calc Af Amer: 41 mL/min — ABNORMAL LOW (ref >60)
GFR calc non Af Amer: 36 mL/min — ABNORMAL LOW (ref >60)
Glucose, Bld: 76 mg/dL (ref 65–99)
POTASSIUM: 3.9 mmol/L (ref 3.5–5.1)
Sodium: 131 mmol/L — ABNORMAL LOW (ref 135–145)

## 2017-04-22 NOTE — Progress Notes (Signed)
Subjective: She says she feels better. She has no new complaints. She feels less swollen. Her INR was incomplete and I don't think her weights are accurate but she clearly has less fluid in her legs. She has no chest pain nausea vomiting diarrhea  Objective: Vital signs in last 24 hours: Temp:  [97.8 F (36.6 C)-98.3 F (36.8 C)] 98.3 F (36.8 C) (06/30 0526) Pulse Rate:  [83-92] 88 (06/30 0526) Resp:  [20] 20 (06/30 0526) BP: (92-93)/(49-55) 92/49 (06/30 0526) SpO2:  [98 %-100 %] 100 % (06/30 0526) Weight:  [95.1 kg (209 lb 10.5 oz)-110.2 kg (243 lb)] 95.1 kg (209 lb 10.5 oz) (06/30 0526) Weight change:  Last BM Date: 04/21/17  Intake/Output from previous day: 06/29 0701 - 06/30 0700 In: 240 [P.O.:240] Out: 750 [Urine:750]  PHYSICAL EXAM General appearance: alert, cooperative and moderate distress Resp: clear to auscultation bilaterally Cardio: regular rate and rhythm, S1, S2 normal, no murmur, click, rub or gallop GI: soft, non-tender; bowel sounds normal; no masses,  no organomegaly Extremities: extremities normal, atraumatic, no cyanosis or edema Skin warm and dry she has 2+ edema still but it is better  Lab Results:  Results for orders placed or performed during the hospital encounter of 04/21/17 (from the past 48 hour(s))  Comprehensive metabolic panel     Status: Abnormal   Collection Time: 04/21/17  2:23 PM  Result Value Ref Range   Sodium 132 (L) 135 - 145 mmol/L   Potassium 3.6 3.5 - 5.1 mmol/L   Chloride 98 (L) 101 - 111 mmol/L   CO2 26 22 - 32 mmol/L   Glucose, Bld 109 (H) 65 - 99 mg/dL   BUN 20 6 - 20 mg/dL   Creatinine, Ser 1.37 (H) 0.44 - 1.00 mg/dL   Calcium 8.2 (L) 8.9 - 10.3 mg/dL   Total Protein 5.8 (L) 6.5 - 8.1 g/dL   Albumin 2.0 (L) 3.5 - 5.0 g/dL   AST 56 (H) 15 - 41 U/L   ALT 35 14 - 54 U/L   Alkaline Phosphatase 38 38 - 126 U/L   Total Bilirubin 0.9 0.3 - 1.2 mg/dL   GFR calc non Af Amer 36 (L) >60 mL/min   GFR calc Af Amer 41 (L) >60 mL/min     Comment: (NOTE) The eGFR has been calculated using the CKD EPI equation. This calculation has not been validated in all clinical situations. eGFR's persistently <60 mL/min signify possible Chronic Kidney Disease.    Anion gap 8 5 - 15  CBC WITH DIFFERENTIAL     Status: Abnormal   Collection Time: 04/21/17  2:23 PM  Result Value Ref Range   WBC 7.9 4.0 - 10.5 K/uL   RBC 3.70 (L) 3.87 - 5.11 MIL/uL   Hemoglobin 10.4 (L) 12.0 - 15.0 g/dL   HCT 30.6 (L) 36.0 - 46.0 %   MCV 82.7 78.0 - 100.0 fL   MCH 28.1 26.0 - 34.0 pg   MCHC 34.0 30.0 - 36.0 g/dL   RDW 19.6 (H) 11.5 - 15.5 %   Platelets 261 150 - 400 K/uL   Neutrophils Relative % 83 %   Neutro Abs 6.5 1.7 - 7.7 K/uL   Lymphocytes Relative 7 %   Lymphs Abs 0.6 (L) 0.7 - 4.0 K/uL   Monocytes Relative 10 %   Monocytes Absolute 0.8 0.1 - 1.0 K/uL   Eosinophils Relative 0 %   Eosinophils Absolute 0.0 0.0 - 0.7 K/uL   Basophils Relative 0 %  Basophils Absolute 0.0 0.0 - 0.1 K/uL  Brain natriuretic peptide     Status: None   Collection Time: 04/21/17  2:28 PM  Result Value Ref Range   B Natriuretic Peptide 52.0 0.0 - 100.0 pg/mL  Urinalysis, Routine w reflex microscopic     Status: Abnormal   Collection Time: 04/21/17  6:00 PM  Result Value Ref Range   Color, Urine YELLOW YELLOW   APPearance HAZY (A) CLEAR   Specific Gravity, Urine 1.008 1.005 - 1.030   pH 5.0 5.0 - 8.0   Glucose, UA NEGATIVE NEGATIVE mg/dL   Hgb urine dipstick LARGE (A) NEGATIVE   Bilirubin Urine NEGATIVE NEGATIVE   Ketones, ur NEGATIVE NEGATIVE mg/dL   Protein, ur NEGATIVE NEGATIVE mg/dL   Nitrite NEGATIVE NEGATIVE   Leukocytes, UA NEGATIVE NEGATIVE   RBC / HPF 6-30 0 - 5 RBC/hpf   WBC, UA 0-5 0 - 5 WBC/hpf   Bacteria, UA RARE (A) NONE SEEN   Squamous Epithelial / LPF 0-5 (A) NONE SEEN   Mucous PRESENT    Budding Yeast PRESENT    Hyaline Casts, UA PRESENT   Basic metabolic panel     Status: Abnormal   Collection Time: 04/22/17  5:58 AM  Result  Value Ref Range   Sodium 131 (L) 135 - 145 mmol/L   Potassium 3.9 3.5 - 5.1 mmol/L   Chloride 98 (L) 101 - 111 mmol/L   CO2 27 22 - 32 mmol/L   Glucose, Bld 76 65 - 99 mg/dL   BUN 20 6 - 20 mg/dL   Creatinine, Ser 1.11 (H) 0.44 - 1.00 mg/dL   Calcium 7.8 (L) 8.9 - 10.3 mg/dL   GFR calc non Af Amer 36 (L) >60 mL/min   GFR calc Af Amer 41 (L) >60 mL/min    Comment: (NOTE) The eGFR has been calculated using the CKD EPI equation. This calculation has not been validated in all clinical situations. eGFR's persistently <60 mL/min signify possible Chronic Kidney Disease.    Anion gap 6 5 - 15    ABGS No results for input(s): PHART, PO2ART, TCO2, HCO3 in the last 72 hours.  Invalid input(s): PCO2 CULTURES No results found for this or any previous visit (from the past 240 hour(s)). Studies/Results: X-ray Chest Pa And Lateral  Result Date: 04/21/2017 CLINICAL DATA:  Bilateral leg swelling EXAM: CHEST  2 VIEW COMPARISON:  02/01/2017 FINDINGS: Lungs remain under aerated. Heterogeneous opacities are again seen throughout both lungs. Normal heart size. No pneumothorax. No pleural effusion. IMPRESSION: Stable chronic heterogeneous opacities throughout both lungs. A chronic pulmonary parenchymal process or even malignancy cannot be excluded. Electronically Signed   By: Jolaine Click M.D.   On: 04/21/2017 14:09    Medications:  Prior to Admission:  Prescriptions Prior to Admission  Medication Sig Dispense Refill Last Dose  . acetaminophen (TYLENOL) 325 MG tablet Take 650 mg by mouth every 4 (four) hours as needed for mild pain or moderate pain.    Past Week at Unknown time  . apixaban (ELIQUIS) 5 MG TABS tablet Take 5 mg by mouth 2 (two) times daily.   04/21/2017 at 900a  . furosemide (LASIX) 40 MG tablet Take 40 mg by mouth daily.   04/19/2017 at Unknown time  . OXYGEN Inhale 2 L into the lungs daily. Oxygen @@ 2 L /mn via every q shift    04/21/2017 at Unknown time  . potassium chloride SA  (K-DUR,KLOR-CON) 20 MEQ tablet Take 20 mEq by mouth  daily.   04/19/2017 at Unknown time   Scheduled: . furosemide  40 mg Intravenous Q12H  . potassium chloride  20 mEq Oral BID  . sodium chloride flush  3 mL Intravenous Q12H   Continuous: . sodium chloride     PQA:ESLPNP chloride, acetaminophen **OR** acetaminophen, polyethylene glycol, sodium chloride flush  Assesment: She has what appears to be diastolic heart failure. She has a history of pulmonary emboli and she is chronically anticoagulated. I considered venous Doppler but I'm not sure that it would change anything since she's already on full anticoagulation. She is better. Active Problems:   CHF (congestive heart failure) ([place])    Plan: Continue current treatments    LOS: 1 day   Tilly Pernice L 04/22/2017, 10:14 AM

## 2017-04-23 MED ORDER — APIXABAN 5 MG PO TABS
5.0000 mg | ORAL_TABLET | Freq: Two times a day (BID) | ORAL | Status: DC
  Administered 2017-04-23 – 2017-04-26 (×7): 5 mg via ORAL
  Filled 2017-04-23 (×7): qty 1

## 2017-04-23 MED ORDER — DICLOFENAC SODIUM 1 % TD GEL
2.0000 g | Freq: Four times a day (QID) | TRANSDERMAL | Status: DC | PRN
  Administered 2017-04-23: 22:00:00 2 g via TOPICAL
  Filled 2017-04-23 (×2): qty 100

## 2017-04-23 NOTE — Progress Notes (Signed)
Subjective: She says she feels better. No new complaints. She did have some trouble with her Foley catheter yesterday but she says it seems to be doing okay now. She continues to diurese. She says her legs feel much better. She has no new complaints  Objective: Vital signs in last 24 hours: Temp:  [97.6 F (36.4 C)-98.3 F (36.8 C)] 97.6 F (36.4 C) (07/01 0535) Pulse Rate:  [87-101] 87 (07/01 0535) Resp:  [16-20] 16 (07/01 0535) BP: (98)/(48) 98/48 (06/30 2131) SpO2:  [97 %-100 %] 100 % (07/01 0535) Weight:  [93.8 kg (206 lb 12.7 oz)] 93.8 kg (206 lb 12.7 oz) (07/01 0535) Weight change: -16.424 kg (-36 lb 3.3 oz) Last BM Date: 04/21/17  Intake/Output from previous day: 06/30 0701 - 07/01 0700 In: 3 [I.V.:3] Out: 900 [Urine:900]  PHYSICAL EXAM General appearance: alert, cooperative and mild distress Resp: clear to auscultation bilaterally Cardio: regular rate and rhythm, S1, S2 normal, no murmur, click, rub or gallop GI: soft, non-tender; bowel sounds normal; no masses,  no organomegaly Extremities: Much less edema of both legs She is blind.  Lab Results:  Results for orders placed or performed during the hospital encounter of 04/21/17 (from the past 48 hour(s))  Comprehensive metabolic panel     Status: Abnormal   Collection Time: 04/21/17  2:23 PM  Result Value Ref Range   Sodium 132 (L) 135 - 145 mmol/L   Potassium 3.6 3.5 - 5.1 mmol/L   Chloride 98 (L) 101 - 111 mmol/L   CO2 26 22 - 32 mmol/L   Glucose, Bld 109 (H) 65 - 99 mg/dL   BUN 20 6 - 20 mg/dL   Creatinine, Ser 1.37 (H) 0.44 - 1.00 mg/dL   Calcium 8.2 (L) 8.9 - 10.3 mg/dL   Total Protein 5.8 (L) 6.5 - 8.1 g/dL   Albumin 2.0 (L) 3.5 - 5.0 g/dL   AST 56 (H) 15 - 41 U/L   ALT 35 14 - 54 U/L   Alkaline Phosphatase 38 38 - 126 U/L   Total Bilirubin 0.9 0.3 - 1.2 mg/dL   GFR calc non Af Amer 36 (L) >60 mL/min   GFR calc Af Amer 41 (L) >60 mL/min    Comment: (NOTE) The eGFR has been calculated using the CKD  EPI equation. This calculation has not been validated in all clinical situations. eGFR's persistently <60 mL/min signify possible Chronic Kidney Disease.    Anion gap 8 5 - 15  CBC WITH DIFFERENTIAL     Status: Abnormal   Collection Time: 04/21/17  2:23 PM  Result Value Ref Range   WBC 7.9 4.0 - 10.5 K/uL   RBC 3.70 (L) 3.87 - 5.11 MIL/uL   Hemoglobin 10.4 (L) 12.0 - 15.0 g/dL   HCT 30.6 (L) 36.0 - 46.0 %   MCV 82.7 78.0 - 100.0 fL   MCH 28.1 26.0 - 34.0 pg   MCHC 34.0 30.0 - 36.0 g/dL   RDW 19.6 (H) 11.5 - 15.5 %   Platelets 261 150 - 400 K/uL   Neutrophils Relative % 83 %   Neutro Abs 6.5 1.7 - 7.7 K/uL   Lymphocytes Relative 7 %   Lymphs Abs 0.6 (L) 0.7 - 4.0 K/uL   Monocytes Relative 10 %   Monocytes Absolute 0.8 0.1 - 1.0 K/uL   Eosinophils Relative 0 %   Eosinophils Absolute 0.0 0.0 - 0.7 K/uL   Basophils Relative 0 %   Basophils Absolute 0.0 0.0 - 0.1 K/uL  Brain natriuretic peptide     Status: None   Collection Time: 04/21/17  2:28 PM  Result Value Ref Range   B Natriuretic Peptide 52.0 0.0 - 100.0 pg/mL  Urinalysis, Routine w reflex microscopic     Status: Abnormal   Collection Time: 04/21/17  6:00 PM  Result Value Ref Range   Color, Urine YELLOW YELLOW   APPearance HAZY (A) CLEAR   Specific Gravity, Urine 1.008 1.005 - 1.030   pH 5.0 5.0 - 8.0   Glucose, UA NEGATIVE NEGATIVE mg/dL   Hgb urine dipstick LARGE (A) NEGATIVE   Bilirubin Urine NEGATIVE NEGATIVE   Ketones, ur NEGATIVE NEGATIVE mg/dL   Protein, ur NEGATIVE NEGATIVE mg/dL   Nitrite NEGATIVE NEGATIVE   Leukocytes, UA NEGATIVE NEGATIVE   RBC / HPF 6-30 0 - 5 RBC/hpf   WBC, UA 0-5 0 - 5 WBC/hpf   Bacteria, UA RARE (A) NONE SEEN   Squamous Epithelial / LPF 0-5 (A) NONE SEEN   Mucous PRESENT    Budding Yeast PRESENT    Hyaline Casts, UA PRESENT   Basic metabolic panel     Status: Abnormal   Collection Time: 04/22/17  5:58 AM  Result Value Ref Range   Sodium 131 (L) 135 - 145 mmol/L   Potassium 3.9  3.5 - 5.1 mmol/L   Chloride 98 (L) 101 - 111 mmol/L   CO2 27 22 - 32 mmol/L   Glucose, Bld 76 65 - 99 mg/dL   BUN 20 6 - 20 mg/dL   Creatinine, Ser 1.37 (H) 0.44 - 1.00 mg/dL   Calcium 7.8 (L) 8.9 - 10.3 mg/dL   GFR calc non Af Amer 36 (L) >60 mL/min   GFR calc Af Amer 41 (L) >60 mL/min    Comment: (NOTE) The eGFR has been calculated using the CKD EPI equation. This calculation has not been validated in all clinical situations. eGFR's persistently <60 mL/min signify possible Chronic Kidney Disease.    Anion gap 6 5 - 15    ABGS No results for input(s): PHART, PO2ART, TCO2, HCO3 in the last 72 hours.  Invalid input(s): PCO2 CULTURES No results found for this or any previous visit (from the past 240 hour(s)). Studies/Results: X-ray Chest Pa And Lateral  Result Date: 04/21/2017 CLINICAL DATA:  Bilateral leg swelling EXAM: CHEST  2 VIEW COMPARISON:  02/01/2017 FINDINGS: Lungs remain under aerated. Heterogeneous opacities are again seen throughout both lungs. Normal heart size. No pneumothorax. No pleural effusion. IMPRESSION: Stable chronic heterogeneous opacities throughout both lungs. A chronic pulmonary parenchymal process or even malignancy cannot be excluded. Electronically Signed   By: Marybelle Killings M.D.   On: 04/21/2017 14:09    Medications:  Prior to Admission:  Prescriptions Prior to Admission  Medication Sig Dispense Refill Last Dose  . acetaminophen (TYLENOL) 325 MG tablet Take 650 mg by mouth every 4 (four) hours as needed for mild pain or moderate pain.    Past Week at Unknown time  . apixaban (ELIQUIS) 5 MG TABS tablet Take 5 mg by mouth 2 (two) times daily.   04/21/2017 at [place]  . furosemide (LASIX) 40 MG tablet Take 40 mg by mouth daily.   04/19/2017 at Unknown time  . OXYGEN Inhale 2 L into the lungs daily. Oxygen @@ 2 L /mn via every q shift    04/21/2017 at Unknown time  . potassium chloride SA (K-DUR,KLOR-CON) 20 MEQ tablet Take 20 mEq by mouth daily.   04/19/2017 at  Unknown time  Scheduled: . furosemide  40 mg Intravenous Q12H  . potassium chloride  20 mEq Oral BID  . sodium chloride flush  3 mL Intravenous Q12H   Continuous: . sodium chloride     XYV:OPFYTW chloride, acetaminophen **OR** acetaminophen, polyethylene glycol, sodium chloride flush  Assesment: She was admitted with congestive heart failure. She has some element of probable COPD. She has had pulmonary emboli and she is chronically anticoagulated. She is diuresing much better. Active Problems:   CHF (congestive heart failure) ([place])    Plan: Continue treatments. Recheck blood in the morning. I think she may be able to go home on the third    LOS: 2 days   [place] Kappes L 04/23/2017, 10:10 AM

## 2017-04-23 NOTE — Progress Notes (Signed)
Pt c/o of foley leaking, old foley removed and [id number] inserted. Pt still c/o of it leaking and now with pain when she urinates. Information passed on to oncoming nurse who will relate to the MD.

## 2017-04-23 NOTE — Progress Notes (Addendum)
Attempted to call Dr. Juanetta GoslingHawkins to request pain med ointment for the patients right heel.  The heel is red, but all skin is intact.  Got the voicemail stating Dr. Juanetta GoslingHawkins was not on call at this time.  He is in [facility] as the on call MD. I will call house supervisor to see who is on call.   [phone] Spoke with Dr. Juanetta GoslingHawkins about the above new orders given and followed.

## 2017-04-24 DIAGNOSIS — Z86711 Personal history of pulmonary embolism: Secondary | ICD-10-CM | POA: Diagnosis present

## 2017-04-24 DIAGNOSIS — I5033 Acute on chronic diastolic (congestive) heart failure: Secondary | ICD-10-CM | POA: Diagnosis present

## 2017-04-24 LAB — BASIC METABOLIC PANEL
ANION GAP: 6 (ref 5–15)
BUN: 21 mg/dL — ABNORMAL HIGH (ref 6–20)
CALCIUM: 7.9 mg/dL — AB (ref 8.9–10.3)
CO2: 30 mmol/L (ref 22–32)
Chloride: 95 mmol/L — ABNORMAL LOW (ref 101–111)
Creatinine, Ser: 1.4 mg/dL — ABNORMAL HIGH (ref 0.44–1.00)
GFR, EST AFRICAN AMERICAN: 40 mL/min — AB (ref >60)
GFR, EST NON AFRICAN AMERICAN: 35 mL/min — AB (ref >60)
GLUCOSE: 85 mg/dL (ref 65–99)
POTASSIUM: 4.1 mmol/L (ref 3.5–5.1)
SODIUM: 131 mmol/L — AB (ref 135–145)

## 2017-04-24 NOTE — Progress Notes (Signed)
Subjective: She says she feels okay. She is breathing much better. No new complaints. No chest pain. Her heel feels better.  Objective: Vital signs in last 24 hours: Temp:  [97.8 F (36.6 C)-98.2 F (36.8 C)] 97.8 F (36.6 C) (07/01 2100) Pulse Rate:  [93-94] 93 (07/01 2100) Resp:  [16] 16 (07/01 2100) BP: (91-98)/(46-62) 91/46 (07/01 2100) SpO2:  [97 %-98 %] 97 % (07/01 2100) Weight change:  Last BM Date: 04/21/17  Intake/Output from previous day: 07/01 0701 - 07/02 0700 In: -  Out: 850 [Urine:850]  PHYSICAL EXAM General appearance: alert, cooperative and no distress Resp: clear to auscultation bilaterally Cardio: regular rate and rhythm, S1, S2 normal, no murmur, click, rub or gallop GI: soft, non-tender; bowel sounds normal; no masses,  no organomegaly Extremities: Her edema is much improved She is blind  Lab Results:  Results for orders placed or performed during the hospital encounter of 04/21/17 (from the past 48 hour(s))  Basic metabolic panel     Status: Abnormal   Collection Time: 04/24/17  6:48 AM  Result Value Ref Range   Sodium 131 (L) 135 - 145 mmol/L   Potassium 4.1 3.5 - 5.1 mmol/L   Chloride 95 (L) 101 - 111 mmol/L   CO2 30 22 - 32 mmol/L   Glucose, Bld 85 65 - 99 mg/dL   BUN 21 (H) 6 - 20 mg/dL   Creatinine, Ser 1.40 (H) 0.44 - 1.00 mg/dL   Calcium 7.9 (L) 8.9 - 10.3 mg/dL   GFR calc non Af Amer 35 (L) >60 mL/min   GFR calc Af Amer 40 (L) >60 mL/min    Comment: (NOTE) The eGFR has been calculated using the CKD EPI equation. This calculation has not been validated in all clinical situations. eGFR's persistently <60 mL/min signify possible Chronic Kidney Disease.    Anion gap 6 5 - 15    ABGS No results for input(s): PHART, PO2ART, TCO2, HCO3 in the last 72 hours.  Invalid input(s): PCO2 CULTURES No results found for this or any previous visit (from the past 240 hour(s)). Studies/Results: No results found.  Medications:  Prior to  Admission:  Prescriptions Prior to Admission  Medication Sig Dispense Refill Last Dose  . acetaminophen (TYLENOL) 325 MG tablet Take 650 mg by mouth every 4 (four) hours as needed for mild pain or moderate pain.    Past Week at Unknown time  . apixaban (ELIQUIS) 5 MG TABS tablet Take 5 mg by mouth 2 (two) times daily.   04/21/2017 at [place]  . furosemide (LASIX) 40 MG tablet Take 40 mg by mouth daily.   04/19/2017 at Unknown time  . OXYGEN Inhale 2 L into the lungs daily. Oxygen @@ 2 L /mn via every q shift    04/21/2017 at Unknown time  . potassium chloride SA (K-DUR,KLOR-CON) 20 MEQ tablet Take 20 mEq by mouth daily.   04/19/2017 at Unknown time   Scheduled: . apixaban  5 mg Oral BID  . furosemide  40 mg Intravenous Q12H  . potassium chloride  20 mEq Oral BID  . sodium chloride flush  3 mL Intravenous Q12H   Continuous: . sodium chloride     RJJ:OACZYS chloride, acetaminophen **OR** acetaminophen, diclofenac sodium, polyethylene glycol, sodium chloride flush  Assesment: She was admitted with acute exacerbation of CHF. She is much improved. She had pulmonary emboli and she is on chronic anticoagulation. She is blind. Active Problems:   CHF (congestive heart failure) ([place])    Plan:  Continue treatments. Probable discharge tomorrow.    LOS: 3 days   Durenda Pechacek L 04/24/2017, 8:37 AM

## 2017-04-24 NOTE — Evaluation (Signed)
Physical Therapy Evaluation Patient Details Name: TONEE SILVERSTEIN MRN: [id number] DOB: 11/12/1935 Today's Date: 04/24/2017      History of Present Illness  81 yo female with onset of CHF, LE edema and pain, and issues with low BP was admitted.  Pt has PMHx:  OA, blindness, macular degeneration, varicose veins, COPD, chronic respiratory failure, PNA, CHF  Clinical Impression  Pt is getting up to side of bed with mod assist, very weak to attempt standing.  Pt is struggling with both contracted joints and weak muscles esp of hips.  Her plan for acute therapy is to work on strength, standing control and rest  starting gait.  Her longer range plan is to get rehab in SNF and then return home as she is able with family support.     Equipment Recommendations  None recommended by PT    Recommendations for Other Services OT consult     Precautions / Restrictions Precautions Precautions: Fall Restrictions Weight Bearing Restrictions: No      Mobility  Bed Mobility Overal bed mobility: Needs Assistance Bed Mobility: Supine to Sit;Sit to Supine     Supine to sit: Mod assist Sit to supine: Mod assist   General bed mobility comments: mainly needs help with LE's to get in and out of bed  Transfers Overall transfer level: Needs assistance Equipment used: Rolling walker (2 wheeled);1 person hand held assist Transfers: Sit to/from Stand Sit to Stand: Max assist         General transfer comment: cues for hand placement with both verbal and tactile cues  Ambulation/Gait             General Gait Details: Pt could not take a step  Stairs            Wheelchair Mobility    Modified Rankin (Stroke Patients Only)       Balance Overall balance assessment: Needs assistance Sitting-balance support: Feet supported Sitting balance-Leahy Scale: Fair     Standing balance support: Bilateral upper extremity supported Standing balance-Leahy Scale: Poor Standing balance comment:  needs max assist to stand partially with knee and hip flexion contractures                             Pertinent Vitals/Pain Pain Assessment: Faces Faces Pain Scale: Hurts little more Pain Location: knees Pain Descriptors / Indicators: Dull Pain Intervention(s): Monitored during session;Repositioned    Home Living Family/patient expects to be discharged to:: Private residence Living Arrangements: Children Available Help at Discharge: Family Type of Home: House Home Access: Stairs to enter   Secretary/administrator of Steps: 1 Home Layout: One level Home Equipment: Cane - single point;Walker - standard Additional Comments: family lives near and helps but does not stay 24/7    Prior Function Level of Independence: Needs assistance   Gait / Transfers Assistance Needed: SPC to walk prior to hospitalization  ADL's / Homemaking Assistance Needed: daughters help with house but cares for herself        Hand Dominance   Dominant Hand: Right    Extremity/Trunk Assessment   Upper Extremity Assessment Upper Extremity Assessment: Overall WFL for tasks assessed    Lower Extremity Assessment Lower Extremity Assessment: Generalized weakness    Cervical / Trunk Assessment Cervical / Trunk Assessment: Normal  Communication   Communication: No difficulties  Cognition Arousal/Alertness: Awake/alert Behavior During Therapy: WFL for tasks assessed/performed Overall Cognitive Status: Within Functional Limits for tasks assessed  General Comments      Exercises     Assessment/Plan    PT Assessment Patient needs continued PT services  PT Problem List Decreased strength;Decreased range of motion;Decreased activity tolerance;Decreased balance;Decreased mobility;Decreased coordination;Decreased knowledge of use of DME;Decreased safety awareness;Cardiopulmonary status limiting activity;Obesity       PT Treatment  Interventions DME instruction;Gait training;Stair training;Functional mobility training;Therapeutic activities;Therapeutic exercise;Balance training;Neuromuscular re-education;Patient/family education    PT Goals (Current goals can be found in the Care Plan section)  Acute Rehab PT Goals Patient Stated Goal: to go directly home PT Goal Formulation: With patient Time For Goal Achievement: 05/08/17 Potential to Achieve Goals: Good    Frequency Min 3X/week   Barriers to discharge Decreased caregiver support home alone    Co-evaluation               AM-PAC PT "6 Clicks" Daily Activity  Outcome Measure Difficulty turning over in bed (including adjusting bedclothes, sheets and blankets)?: Total Difficulty moving from lying on back to sitting on the side of the bed? : Total Difficulty sitting down on and standing up from a chair with arms (e.g., wheelchair, bedside commode, etc,.)?: Total Help needed moving to and from a bed to chair (including a wheelchair)?: A Lot Help needed walking in hospital room?: Total Help needed climbing 3-5 steps with a railing? : Total 6 Click Score: 7    End of Session Equipment Utilized During Treatment: Gait belt Activity Tolerance: Patient limited by fatigue;Patient limited by lethargy Patient left: in bed;with call bell/phone within reach;with bed alarm set Nurse Communication: Mobility status PT Visit Diagnosis: Unsteadiness on feet (R26.81);Muscle weakness (generalized) (M62.81);Difficulty in walking, not elsewhere classified (R26.2)    Time: 1445-1511 PT Time Calculation (min) (ACUTE ONLY): 26 min   Charges:   PT Evaluation $PT Eval Moderate Complexity: 1 Procedure PT Treatments $Gait Training: 8-22 mins   PT G Codes:   PT G-Codes **NOT FOR INPATIENT CLASS** Functional Assessment Tool Used: AM-PAC 6 Clicks Basic Mobility    Ivar DrapeRuth E Audy Dauphine 04/24/2017, 9:33 PM   9:39 PM, 04/24/17 Samul Dadauth Genelda Roark, PT, MS Physical Therapist - [facility] [phone](ASCOM)  [phone] (Office)

## 2017-04-24 NOTE — H&P (Signed)
NAME:  Heather Miller, Ruhi                      ACCOUNT NO.:  MEDICAL RECORD NO.:  [id number]  LOCATION:                                 FACILITY:  PHYSICIAN:  Press Casale L. Juanetta GoslingHawkins, M.D.DATE OF BIRTH:  [birth year]  DATE OF ADMISSION: DATE OF DISCHARGE:  LH                             HISTORY & PHYSICAL   HISTORY OF PRESENT ILLNESS:  Heather Miller is a 81 year old, who came to my office on the day of admission with increasing swelling.  She had been started on Lasix about 6 weeks ago and she has been taking it, but it is still giving her a lot of swelling.  She has a history of arthritis. She also has blindness in both eyes.  In the fairly recent past, she has had an episode of pneumonia and sepsis and then she has had pulmonary emboli.  She was found to have a lot of swelling of her legs about 6 weeks ago, got started on the Lasix, but it has not helped.  Her family says that she is having trouble getting to the bedside commode.  No change in diet.  No change in medications except the addition of the Lasix.  No chest pain.  No nausea, vomiting, diarrhea.  PAST MEDICAL HISTORY:  Positive for: 1. Arthritis. 2. Blindness. 3. Macular degeneration. 4. Varicose veins. 5. COPD. 6. History of pneumonia. 7. Chronic hypoxic respiratory failure. 8. Congestive heart failure.  PAST SURGICAL HISTORY:  She has had a cystoscopy, she has had an eye surgery, she has had arthroscopy of her knee.  SOCIAL HISTORY:  She lives at home with family.  She is a former smoker, but stopped many years ago.  ALLERGIES:  None known.  MEDICATION LIST:  At home, she has been on: 1. Eliquis 5 mg b.i.d.. 2. Lasix 40 mg daily.  PHYSICAL EXAMINATION:  GENERAL:  Shows a well-developed, well-nourished female, who is in no acute distress. VITAL SIGNS:  Blood pressure 120/70, pulse about 120, respirations 18. CONSTITUTIONAL:  She is awake and alert, in no acute distress. EYES:  She is sightless in both eyes.  She has had  removal of the right eye. EARS, NOSE, MOUTH, AND THROAT:  Her mucous membranes are moist.  Her hearing is grossly normal. CARDIOVASCULAR:  Her heart is regular with tachycardia.  She has 3+ edema bilaterally. RESPIRATORY:  Her respiratory effort is slightly increased. GASTROINTESTINAL:  Her abdomen is soft with no masses. SKIN:  Warm and dry. MUSCULOSKELETAL:  She is in a wheelchair and is weak in her legs. NEUROLOGICAL:  Other than her blindness, no focal abnormalities. PSYCHIATRIC:  Normal mood and affect.  ASSESSMENT:  I think she has acute exacerbation of heart failure, which is not responding to her current treatment at home.  I think she is going to need IV diuresis and that will be started.  She will continue with everything else.  I did not change any other treatments today.  She may need Cardiology consultation.  She will have echocardiogram.     Ramon DredgeEdward L. Juanetta GoslingHawkins, M.D.     ELH/MEDQ  D:  04/21/2017  T:  04/22/2017  Job:  [id number] 

## 2017-04-24 NOTE — Plan of Care (Signed)
Problem: Food- and Nutrition-Related Knowledge Deficit (NB-1.1) Goal: Nutrition education Formal process to instruct or train a patient/client in a skill or to impart knowledge to help patients/clients voluntarily manage or modify food choices and eating behavior to maintain or improve health. Outcome: Adequate for Discharge Nutrition Education Note  RD consulted for nutrition education regarding acute onset CHF.   The patient is a pleasant 81 yo who says she lives alone. She is blind and says her daughter Heather Miller lives only "50 ft" away from her and helps with her food and daily care. Heather Miller has been eating convenience foods (frozen meals) and particularly high intake of cheese. She has not been weighing herself lately due to the edema in her feet. She does have a scale and was encouraged to have Heather Miller help to weigh her and record daily.  RD provided "Heart Failure Nutrition Therapy" handout from the Academy of [employer]etics. Reviewed patient's dietary recall. Provided examples on ways to decrease sodium intake in diet.   Discouraged intake of processed foods (canned soups, canned meats, boxed foods),  and use of salt shaker. Encouraged fresh fruits and vegetables and fresh frozen (plain) veggies as well as whole grain sources of carbohydrates to maximize fiber intake.   RD discussed why it is important for patient to adhere to diet recommendations, and emphasized the role of fluids, foods to avoid, and importance of weighing self daily. Teach back method used.  Expect good compliance. The patient says she will be a client that follows up and does what she has learned. (in particular avoiding cheese products and choosing lower sodium frozen meal options).  Body mass index is 32.39 kg/m. Pt meets criteria for obese based on current BMI.  Current diet order is Heart Healthy, patient is consuming approximately 75% of meals at this time.  No further nutrition interventions warranted  at this time. The patient is at a disadvantage given she is depending on others for her food choices and preporation. Her daughter Heather Deed(Shiela) has been provided with a copy of handout and encouraged pt to have her call RD with any follow up questions.  Royann ShiversLynn Lynnley Doddridge Heather,RD,CSG,LDN Office: [phone] Pager: [phone]

## 2017-04-24 NOTE — Care Management Note (Signed)
Case Management Note  Patient Details  Name: Heather GarrisonSarah P Miller MRN: [id number] Date of Birth: 10/14/1936  Subjective/Objective:                  Pt admitted with CHF. She is from home, she lives in her own home but has daughter that lives next door who is very active in her care. Pt does her own bathing and dressing, daughter prepares her meals. She uses a walker and has a WC if she needs it. She has continuous home oxygen, she can not remember the name of the provider. Pt is planning to return home at DC. [facility] if MD felt she needed it, can not remember the [facility] of [facility] she used before but would like the same company.   Action/Plan: DC anticipated tomorrow, CM will follow and [facility]. PT eval pending.   Expected Discharge Date:    04/25/2017              Expected Discharge Plan:  Home w Home Health Services  In-House Referral:  NA  Discharge planning Services  CM Consult  Post Acute Care Choice:  Home Health Choice offered to:  Patient  Status of Service:  In process, will continue to follow   Malcolm MetroChildress, Keylin Podolsky Demske, RN 04/24/2017, 11:36 AM

## 2017-04-25 MED ORDER — FUROSEMIDE 40 MG PO TABS: 40.0000 mg | ORAL_TABLET | Freq: Two times a day (BID) | ORAL | Status: DC

## 2017-04-25 MED ORDER — FUROSEMIDE 40 MG PO TABS: 40.0000 mg | ORAL_TABLET | Freq: Two times a day (BID) | ORAL | 5 refills | Status: AC

## 2017-04-25 NOTE — Clinical Social Work Placement (Signed)
   CLINICAL SOCIAL WORK PLACEMENT  NOTE  Date:  04/25/2017  Patient Details  Name: Heather Miller MRN: [id number] Date of Birth: 09/22/1936  Clinical Social Work is seeking post-discharge placement for this patient at the Skilled  Nursing Facility level of care (*CSW will initial, date and re-position this form in  chart as items are completed):  Yes   Patient/family provided with [facility] Clinical Social Work Department's list of facilities offering this level of care within the geographic area requested by the patient (or if unable, by the patient's family).  Yes   Patient/family informed of their freedom to choose among providers that offer the needed level of care, that participate in Medicare, Medicaid or managed care program needed by the patient, have an available bed and are willing to accept the patient.  Yes   Patient/family informed of [facility]'s ownership interest in [facility] and [facility], as well as of the fact that they are under no obligation to receive care at these facilities.  PASRR submitted to EDS on       PASRR number received on       Existing PASRR number confirmed on 04/25/17     FL2 transmitted to all facilities in geographic area requested by pt/family on 04/25/17     FL2 transmitted to all facilities within larger geographic area on       Patient informed that his/her managed care company has contracts with or will negotiate with certain facilities, including the following:            Patient/family informed of bed offers received.  Patient chooses bed at       Physician recommends and patient chooses bed at      Patient to be transferred to   on  .  Patient to be transferred to facility by       Patient family notified on   of transfer.  Name of family member notified:        PHYSICIAN       Additional Comment:    _______________________________________________ Annice NeedySettle, Hurbert Duran D, LCSW 04/25/2017, 1:36 PM

## 2017-04-25 NOTE — Discharge Summary (Signed)
Physician Discharge Summary  Patient ID: Heather GarrisonSarah P Miller MRN: [id number] DOB/AGE: 79/11/1935 79 y.o. Primary Care Physician:Kim, Fayrene FearingJames, MD Admit date: 04/21/2017 Discharge date: 04/25/2017    Discharge Diagnoses:   Principal Problem:   Acute on chronic diastolic heart failure (HCC) Active Problems:   Macular degeneration (senile) of retina   Acute respiratory failure with hypoxia (HCC)   CHF (congestive heart failure) (HCC)   Personal history of pulmonary embolism   Allergies as of 04/25/2017   No Known Allergies     Medication List    TAKE these medications   acetaminophen 325 MG tablet Commonly known as:  TYLENOL Take 650 mg by mouth every 4 (four) hours as needed for mild pain or moderate pain.   ELIQUIS 5 MG Tabs tablet Generic drug:  apixaban Take 5 mg by mouth 2 (two) times daily.   furosemide 40 MG tablet Commonly known as:  LASIX Take 1 tablet (40 mg total) by mouth 2 (two) times daily. What changed:  when to take this   OXYGEN Inhale 2 L into the lungs daily. Oxygen @@ 2 L /mn via every q shift   potassium chloride SA 20 MEQ tablet Commonly known as:  K-DUR,KLOR-CON Take 20 mEq by mouth daily.       Discharged Condition:Improved    Consults: None  Significant Diagnostic Studies: X-ray Chest Pa And Lateral  Result Date: 04/21/2017 CLINICAL DATA:  Bilateral leg swelling EXAM: CHEST  2 VIEW COMPARISON:  02/01/2017 FINDINGS: Lungs remain under aerated. Heterogeneous opacities are again seen throughout both lungs. Normal heart size. No pneumothorax. No pleural effusion. IMPRESSION: Stable chronic heterogeneous opacities throughout both lungs. A chronic pulmonary parenchymal process or even malignancy cannot be excluded. Electronically Signed   By: Jolaine ClickArthur  Hoss M.D.   On: 04/21/2017 14:09    Lab Results: Basic Metabolic Panel:  Recent Labs  40/98/1105/12/11 0648  NA 131*  K 4.1  CL 95*  CO2 30  GLUCOSE 85  BUN 21*  CREATININE 1.40*  CALCIUM 7.9*   Liver  Function Tests: No results for input(s): AST, ALT, ALKPHOS, BILITOT, PROT, ALBUMIN in the last 72 hours.   CBC: No results for input(s): WBC, NEUTROABS, HGB, HCT, MCV, PLT in the last 72 hours.  No results found for this or any previous visit (from the past 240 hour(s)).   Hospital Course: This is a 81 year old with a known history of heart failure who came to my office with markedly increased swelling of her legs. She was unable to get to the bathroom. We discussed outpatient treatment but after discussion it was felt that she should go ahead and be admitted. She was started on IV furosemide and did much better. She had PT evaluation it was felt that she should go to skilled care facility for rehabilitation which I think is appropriate. This is being arranged.  Discharge Exam: Blood pressure (!) 95/55, pulse 91, temperature 98 F (36.7 C), temperature source Oral, resp. rate 18, height 5\' 7"  (1.702 m), weight 92.8 kg (204 lb 9.4 oz), SpO2 100 %. She is awake and alert. She is blind. She still has minimal fluid in her legs but it's much better.  Disposition: Transfer to skilled care facility when bed available. She will need PT OT and speech as needed.      Signed: Syesha Thaw L   04/25/2017, 9:09 AM

## 2017-04-25 NOTE — Progress Notes (Signed)
Subjective: She says the physical therapy recommended that she go to skilled care facility. I think that's reasonable considering her overall situation. She has no new complaints.  Objective: Vital signs in last 24 hours: Temp:  [97.9 F (36.6 C)-98.1 F (36.7 C)] 98 F (36.7 C) (07/03 0525) Pulse Rate:  [88-96] 91 (07/03 0525) Resp:  [18] 18 (07/03 0525) BP: (95-96)/(55-65) 95/55 (07/03 0525) SpO2:  [97 %-100 %] 100 % (07/03 0525) Weight:  [92.8 kg (204 lb 9.4 oz)] 92.8 kg (204 lb 9.4 oz) (07/03 0500) Weight change:  Last BM Date: 04/25/17  Intake/Output from previous day: 07/02 0701 - 07/03 0700 In: 1080 [P.O.:1080] Out: 1502 [Urine:1500; Stool:2]  PHYSICAL EXAM General appearance: alert, cooperative and no distress Resp: clear to auscultation bilaterally Cardio: regular rate and rhythm, S1, S2 normal, no murmur, click, rub or gallop GI: soft, non-tender; bowel sounds normal; no masses,  no organomegaly Extremities: She has much less edema Skin warm and dry. She is blind  Lab Results:  Results for orders placed or performed during the hospital encounter of 04/21/17 (from the past 48 hour(s))  Basic metabolic panel     Status: Abnormal   Collection Time: 04/24/17  6:48 AM  Result Value Ref Range   Sodium 131 (Miller) 135 - 145 mmol/Miller   Potassium 4.1 3.5 - 5.1 mmol/Miller   Chloride 95 (Miller) 101 - 111 mmol/Miller   CO2 30 22 - 32 mmol/Miller   Glucose, Bld 85 65 - 99 mg/dL   BUN 21 (H) 6 - 20 mg/dL   Creatinine, Ser 1.40 (H) 0.44 - 1.00 mg/dL   Calcium 7.9 (Miller) 8.9 - 10.3 mg/dL   GFR calc non Af Amer 35 (Miller) >60 mL/min   GFR calc Af Amer 40 (Miller) >60 mL/min    Comment: (NOTE) The eGFR has been calculated using the CKD EPI equation. This calculation has not been validated in all clinical situations. eGFR's persistently <60 mL/min signify possible Chronic Kidney Disease.    Anion gap 6 5 - 15    ABGS No results for input(s): PHART, PO2ART, TCO2, HCO3 in the last 72 hours.  Invalid  input(s): PCO2 CULTURES No results found for this or any previous visit (from the past 240 hour(s)). Studies/Results: No results found.  Medications:  Prior to Admission:  Prescriptions Prior to Admission  Medication Sig Dispense Refill Last Dose  . acetaminophen (TYLENOL) 325 MG tablet Take 650 mg by mouth every 4 (four) hours as needed for mild pain or moderate pain.    Past Week at Unknown time  . apixaban (ELIQUIS) 5 MG TABS tablet Take 5 mg by mouth 2 (two) times daily.   04/21/2017 at [facility]  . furosemide (LASIX) 40 MG tablet Take 40 mg by mouth daily.   04/19/2017 at Unknown time  . OXYGEN Inhale 2 Miller into the lungs daily. Oxygen @@ 2 Miller /mn via every q shift    04/21/2017 at Unknown time  . potassium chloride SA (K-DUR,KLOR-CON) 20 MEQ tablet Take 20 mEq by mouth daily.   04/19/2017 at Unknown time   Scheduled: . apixaban  5 mg Oral BID  . furosemide  40 mg Intravenous Q12H  . potassium chloride  20 mEq Oral BID  . sodium chloride flush  3 mL Intravenous Q12H   Continuous: . sodium chloride     KGY:JEHUDJ chloride, acetaminophen **OR** acetaminophen, diclofenac sodium, polyethylene glycol, sodium chloride flush  Assesment: she has acute on chronic diastolic heart failure. She feels much  better. Skilled care facility rehabilitation has been recommended and I think that's reasonable. Principal Problem:   Acute on chronic diastolic heart failure (HCC) Active Problems:   Macular degeneration (senile) of retina   Acute respiratory failure with hypoxia (HCC)   CHF (congestive heart failure) (HCC)   Personal history of pulmonary embolism    Plan: To skilled care facility when bed available    LOS: 4 days   Heather Miller 04/25/2017, 8:41 AM

## 2017-04-25 NOTE — Progress Notes (Signed)
Foley having small amount of leakage.  Balloon further inflated with an extra 5cc of saline.

## 2017-04-25 NOTE — Clinical Social Work Note (Signed)
Clinical Social Work Assessment  Patient Details  Name: Heather GarrisonSarah P Miller MRN: [id number] Date of Birth: 05/07/1936  Date of referral:  04/25/17               Reason for consult:  Discharge Planning                Permission sought to share information with:    Permission granted to share information::     Name::        Agency::     Relationship::     Contact Information:     Housing/Transportation Living arrangements for the past 2 months:  Single Family Home Source of Information:  Patient Patient Interpreter Needed:  None Criminal Activity/Legal Involvement Pertinent to Current Situation/Hospitalization:  No - Comment as needed Significant Relationships:  Adult Children Lives with:  Self Do you feel safe going back to the place where you live?  Yes Need for family participation in patient care:  Yes (Comment)  Care giving concerns:  None identified by patient at baseline.    Social Worker assessment / plan:  Patient lives alone. Her daughter lives next door.  At baseline, patient ambulates with a walker.  She completes ADLs independently. Patient is agreeable to SNF. [facility]   Employment status:  Retired Health and safety inspectornsurance information:  Medicare PT Recommendations:  Skilled Nursing Facility Information / Referral to community resources:  Skilled Nursing Facility  Patient/Family's Response to care:  Patient is agreeable to SNF. Wants PNC.   Patient/Family's Understanding of and Emotional Response to Diagnosis, Current Treatment, and Prognosis:  Patient is aware and understands her diagnosis, treatment and prognosis.    Emotional Assessment Appearance:  Appears stated age Attitude/Demeanor/Rapport:    Affect (typically observed):  Accepting, Calm Orientation:  Oriented to Self, Oriented to Place, Oriented to  Time, Oriented to Situation Alcohol / Substance use:  Not Applicable Psych involvement (Current and /or in the community):  No (Comment)  Discharge  Needs  Concerns to be addressed:  Discharge Planning Concerns Readmission within the last 30 days:  No Current discharge risk:  Lives alone Barriers to Discharge:  No Barriers Identified   Heather Miller, Heather Mihelich D, LCSW 04/25/2017, 1:34 PM

## 2017-04-25 NOTE — Clinical Social Work Note (Signed)
LCSW spoke with patient and daughter related to [phone] being [facility] and it needing to be paid prior to admission.  Family indicated that they did not have the funds to pay this at this time.  Family requested [facility]    Genese Quebedeaux, Juleen ChinaHeather D, LCSW

## 2017-04-25 NOTE — [place] FL2 (Signed)
[place] MEDICAID FL2 LEVEL OF CARE SCREENING TOOL     IDENTIFICATION  Patient Name: Heather GarrisonSarah P Cappucci Birthdate: 09/10/1936 Sex: female Admission Date (Current Location): 04/21/2017  [facility] [place]   Facility and Address:  [facility],  [address], [place] [id number]      Provider Number: [id number]  Attending Physician Name and Address:  Kari BaarsHawkins, Edward, MD  Relative Name and Phone Number:       Current Level of Care: Hospital Recommended Level of Care: Skilled Nursing Facility Prior Approval Number:    Date Approved/Denied:   PASRR Number: [id number] ([id number])  Discharge Plan: SNF    Current Diagnoses: Patient Active Problem List   Diagnosis Date Noted  . Acute on chronic diastolic heart failure (HCC) 04/24/2017  . Personal history of pulmonary embolism 04/24/2017  . CHF (congestive heart failure) (HCC) 04/21/2017  . Influenza 11/28/2016  . COPD (chronic obstructive pulmonary disease) (HCC) 11/12/2016  . Acute respiratory failure with hypoxia (HCC) 11/11/2016  . CAP (community acquired pneumonia) 11/11/2016  . Congestive heart failure (CHF) (HCC) 11/11/2016  . Hypokalemia 11/11/2016  . Macular degeneration (senile) of retina 07/15/2009  . CHEST PAIN 07/15/2009    Orientation RESPIRATION BLADDER Height & Weight     Self, Time, Situation, Place  O2 (1.5L) Continent Weight: 204 lb 9.4 oz (92.8 kg) Height:  5\' 7"  (170.2 cm)  BEHAVIORAL SYMPTOMS/MOOD NEUROLOGICAL BOWEL NUTRITION STATUS      Continent Diet (Heart Healthy)  AMBULATORY STATUS COMMUNICATION OF NEEDS Skin   Limited Assist Verbally Other (Comment) (Wound/incision sacrum)                       Personal Care Assistance Level of Assistance  Bathing, Dressing, Feeding Bathing Assistance: Limited assistance Feeding assistance: Independent Dressing Assistance: Limited assistance     Functional Limitations Info  Sight, Hearing, Speech Sight Info:  Impaired (Blindness in left eye) Hearing Info: Adequate Speech Info: Adequate    SPECIAL CARE FACTORS FREQUENCY  PT (By licensed PT)     PT Frequency: 5x/week              Contractures Contractures Info: Not present    Additional Factors Info  Code Status Code Status Info: Full             Current Medications (04/25/2017):  This is the current hospital active medication list Current Facility-Administered Medications  Medication Dose Route Frequency Provider Last Rate Last Dose  . 0.9 %  sodium chloride infusion  250 mL Intravenous PRN Kari BaarsHawkins, Edward, MD      . acetaminophen (TYLENOL) tablet 650 mg  650 mg Oral Q6H PRN Kari BaarsHawkins, Edward, MD   650 mg at 04/23/17 1355   Or  . acetaminophen (TYLENOL) suppository 650 mg  650 mg Rectal Q6H PRN Kari BaarsHawkins, Edward, MD      . apixaban Everlene Balls(ELIQUIS) tablet 5 mg  5 mg Oral BID Kari BaarsHawkins, Edward, MD   5 mg at 04/25/17 [id number]  . diclofenac sodium (VOLTAREN) 1 % transdermal gel 2 g  2 g Topical QID PRN Kari BaarsHawkins, Edward, MD   2 g at 04/23/17 2142  . furosemide (LASIX) injection 40 mg  40 mg Intravenous Thomasene MohairQ12H Hawkins, Edward, MD   40 mg at 04/25/17 0200  . polyethylene glycol (MIRALAX / GLYCOLAX) packet 17 g  17 g Oral Daily PRN Kari BaarsHawkins, Edward, MD      . potassium chloride SA (K-DUR,KLOR-CON) CR tablet 20 mEq  20 mEq  Oral BID Kari Baars, MD   20 mEq at 04/25/17 0454  . sodium chloride flush (NS) 0.9 % injection 3 mL  3 mL Intravenous Q12H Kari Baars, MD   3 mL at 04/25/17 0981  . sodium chloride flush (NS) 0.9 % injection 3 mL  3 mL Intravenous PRN Kari Baars, MD         Discharge Medications: Please see discharge summary for a list of discharge medications.  Relevant Imaging Results:  Relevant Lab Results:   Additional Information SSN: [id number]  Heather Needy, LCSW

## 2017-04-26 NOTE — Care Management Note (Signed)
Case Management Note  Patient Details  Name: Heather GarrisonSarah P Petropoulos MRN: [id number] Date of Birth: 10/12/1936   Expected Discharge Date:  04/26/17               Expected Discharge Plan:  Skilled Nursing Facility  In-House Referral:  NA  Discharge planning Services  CM Consult  Post Acute Care Choice:  NA Choice offered to:  NA  Status of Service:  Completed, signed off  Additional Comments: Pt discharging to SNF today. CSW has made arrangements.   Malcolm Metrohildress, Devon Kingdon Demske, RN 04/26/2017, 10:52 AM

## 2017-04-26 NOTE — Clinical Social Work Placement (Signed)
   CLINICAL SOCIAL WORK PLACEMENT  NOTE  Date:  04/26/2017  Patient Details  Name: Heather GarrisonSarah P Loveland MRN: [id number] Date of Birth: 11/08/1935  Clinical Social Work is seeking post-discharge placement for this patient at the Skilled  Nursing Facility level of care (*CSW will initial, date and re-position this form in  chart as items are completed):  Yes   Patient/family provided with [facility] Clinical Social Work Department's list of facilities offering this level of care within the geographic area requested by the patient (or if unable, by the patient's family).  Yes   Patient/family informed of their freedom to choose among providers that offer the needed level of care, that participate in Medicare, Medicaid or managed care program needed by the patient, have an available bed and are willing to accept the patient.  Yes   Patient/family informed of [facility]'s ownership interest in [facility] and [facility], as well as of the fact that they are under no obligation to receive care at these facilities.  PASRR submitted to EDS on       PASRR number received on       Existing PASRR number confirmed on 04/25/17     FL2 transmitted to all facilities in geographic area requested by pt/family on 04/25/17     FL2 transmitted to all facilities within larger geographic area on 04/26/17     Patient informed that his/her managed care company has contracts with or will negotiate with certain facilities, including the following:        Yes   Patient/family informed of bed offers received.  [facility]     Physician recommends and patient chooses bed at      [facility] 04/26/17.  [facility]     Patient family notified on 04/26/17 of transfer.  Name of family member notified:  Velna HatchetSheila- daughter     PHYSICIAN       Additional Comment:     _______________________________________________ Karn CassisStultz, Aadith Raudenbush Shanaberger, LCSW 04/26/2017, 10:50 AM [phone]

## 2017-04-26 NOTE — Progress Notes (Signed)
Report called to Harriette OharaBarbara Roberts at [facility].

## 2017-04-26 NOTE — Progress Notes (Signed)
She is ready for transfer to skilled care facility when bed is available. She says she feels okay.

## 2017-04-26 NOTE — Care Management Important Message (Signed)
Important Message  Patient Details  Name: Heather GarrisonSarah P Miller MRN: [id number] Date of Birth: 08/03/1936   Medicare Important Message Given:  Yes    Malcolm Metrohildress, Jarad Barth Demske, RN 04/26/2017, 10:51 AM

## 2017-07-18 ENCOUNTER — Other Ambulatory Visit (HOSPITAL_COMMUNITY)
Admission: RE | Admit: 2017-07-18 | Discharge: 2017-07-18 | Disposition: A | Discharge status: Home / Self Care | Payer: Medicare Other | Source: Other Acute Inpatient Hospital | Attending: Internal Medicine | Admitting: Internal Medicine

## 2017-07-18 DIAGNOSIS — J9611 Chronic respiratory failure with hypoxia: Secondary | ICD-10-CM | POA: Insufficient documentation

## 2017-07-18 LAB — CBC WITH DIFFERENTIAL/PLATELET
BASOS PCT: 1 %
Basophils Absolute: 0.1 10*3/uL (ref 0.0–0.1)
EOS ABS: 0.3 10*3/uL (ref 0.0–0.7)
Eosinophils Relative: 4 %
HEMATOCRIT: 29.8 % — AB (ref 36.0–46.0)
HEMOGLOBIN: 9.9 g/dL — AB (ref 12.0–15.0)
Lymphocytes Relative: 9 %
Lymphs Abs: 0.7 10*3/uL (ref 0.7–4.0)
MCH: 29 pg (ref 26.0–34.0)
MCHC: 33.2 g/dL (ref 30.0–36.0)
MCV: 87.4 fL (ref 78.0–100.0)
MONO ABS: 0.6 10*3/uL (ref 0.1–1.0)
MONOS PCT: 8 %
NEUTROS ABS: 6.2 10*3/uL (ref 1.7–7.7)
NEUTROS PCT: 79 %
Platelets: 285 10*3/uL (ref 150–400)
RBC: 3.41 MIL/uL — ABNORMAL LOW (ref 3.87–5.11)
RDW: 15.4 % (ref 11.5–15.5)
WBC: 7.8 10*3/uL (ref 4.0–10.5)

## 2017-10-08 ENCOUNTER — Encounter (HOSPITAL_COMMUNITY): Payer: Self-pay | Admitting: Emergency Medicine

## 2017-10-08 ENCOUNTER — Emergency Department (HOSPITAL_COMMUNITY): Payer: Medicare Other

## 2017-10-08 ENCOUNTER — Other Ambulatory Visit: Payer: Self-pay

## 2017-10-08 ENCOUNTER — Inpatient Hospital Stay (HOSPITAL_COMMUNITY)
Admission: EM | Admit: 2017-10-08 | Discharge: 2017-10-12 | DRG: 193 | Disposition: A | Discharge status: Skilled Nursing Facility | Payer: Medicare Other | Attending: Pulmonary Disease | Admitting: Pulmonary Disease

## 2017-10-08 DIAGNOSIS — Z7901 Long term (current) use of anticoagulants: Secondary | ICD-10-CM

## 2017-10-08 DIAGNOSIS — J154 Pneumonia due to other streptococci: Principal | ICD-10-CM | POA: Diagnosis present

## 2017-10-08 DIAGNOSIS — J440 Chronic obstructive pulmonary disease with acute lower respiratory infection: Secondary | ICD-10-CM | POA: Diagnosis present

## 2017-10-08 DIAGNOSIS — J189 Pneumonia, unspecified organism: Secondary | ICD-10-CM | POA: Diagnosis not present

## 2017-10-08 DIAGNOSIS — Z9981 Dependence on supplemental oxygen: Secondary | ICD-10-CM

## 2017-10-08 DIAGNOSIS — L89151 Pressure ulcer of sacral region, stage 1: Secondary | ICD-10-CM | POA: Diagnosis present

## 2017-10-08 DIAGNOSIS — R0602 Shortness of breath: Secondary | ICD-10-CM | POA: Diagnosis not present

## 2017-10-08 DIAGNOSIS — Z79899 Other long term (current) drug therapy: Secondary | ICD-10-CM

## 2017-10-08 DIAGNOSIS — E860 Dehydration: Secondary | ICD-10-CM | POA: Diagnosis present

## 2017-10-08 DIAGNOSIS — H3530 Unspecified macular degeneration: Secondary | ICD-10-CM | POA: Diagnosis present

## 2017-10-08 DIAGNOSIS — I509 Heart failure, unspecified: Secondary | ICD-10-CM

## 2017-10-08 DIAGNOSIS — I5032 Chronic diastolic (congestive) heart failure: Secondary | ICD-10-CM | POA: Diagnosis present

## 2017-10-08 DIAGNOSIS — J449 Chronic obstructive pulmonary disease, unspecified: Secondary | ICD-10-CM | POA: Diagnosis present

## 2017-10-08 DIAGNOSIS — Z87891 Personal history of nicotine dependence: Secondary | ICD-10-CM

## 2017-10-08 DIAGNOSIS — Z86711 Personal history of pulmonary embolism: Secondary | ICD-10-CM

## 2017-10-08 DIAGNOSIS — D638 Anemia in other chronic diseases classified elsewhere: Secondary | ICD-10-CM | POA: Diagnosis present

## 2017-10-08 DIAGNOSIS — H548 Legal blindness, as defined in USA: Secondary | ICD-10-CM | POA: Diagnosis present

## 2017-10-08 DIAGNOSIS — E871 Hypo-osmolality and hyponatremia: Secondary | ICD-10-CM | POA: Diagnosis present

## 2017-10-08 DIAGNOSIS — J9601 Acute respiratory failure with hypoxia: Secondary | ICD-10-CM | POA: Diagnosis present

## 2017-10-08 HISTORY — DX: Other pulmonary embolism without acute cor pulmonale: I26.99

## 2017-10-08 LAB — STREP PNEUMONIAE URINARY ANTIGEN: STREP PNEUMO URINARY ANTIGEN: NEGATIVE

## 2017-10-08 LAB — COMPREHENSIVE METABOLIC PANEL
ALT: 14 U/L (ref 14–54)
ANION GAP: 3 — AB (ref 5–15)
AST: 21 U/L (ref 15–41)
Albumin: 1.5 g/dL — ABNORMAL LOW (ref 3.5–5.0)
Alkaline Phosphatase: 56 U/L (ref 38–126)
BUN: 21 mg/dL — ABNORMAL HIGH (ref 6–20)
CHLORIDE: 99 mmol/L — AB (ref 101–111)
CO2: 26 mmol/L (ref 22–32)
CREATININE: 1.39 mg/dL — AB (ref 0.44–1.00)
Calcium: 7.8 mg/dL — ABNORMAL LOW (ref 8.9–10.3)
GFR, EST AFRICAN AMERICAN: 41 mL/min — AB (ref >60)
GFR, EST NON AFRICAN AMERICAN: 35 mL/min — AB (ref >60)
Glucose, Bld: 95 mg/dL (ref 65–99)
POTASSIUM: 5 mmol/L (ref 3.5–5.1)
SODIUM: 128 mmol/L — AB (ref 135–145)
Total Bilirubin: 0.4 mg/dL (ref 0.3–1.2)
Total Protein: 4.9 g/dL — ABNORMAL LOW (ref 6.5–8.1)

## 2017-10-08 LAB — CBC WITH DIFFERENTIAL/PLATELET
Basophils Absolute: 0 10*3/uL (ref 0.0–0.1)
Basophils Relative: 0 %
EOS ABS: 0.1 10*3/uL (ref 0.0–0.7)
EOS PCT: 1 %
HCT: 26 % — ABNORMAL LOW (ref 36.0–46.0)
Hemoglobin: 8.4 g/dL — ABNORMAL LOW (ref 12.0–15.0)
Lymphocytes Relative: 12 %
Lymphs Abs: 1 10*3/uL (ref 0.7–4.0)
MCH: 28.6 pg (ref 26.0–34.0)
MCHC: 32.3 g/dL (ref 30.0–36.0)
MCV: 88.4 fL (ref 78.0–100.0)
MONO ABS: 0.8 10*3/uL (ref 0.1–1.0)
Monocytes Relative: 10 %
Neutro Abs: 6.2 10*3/uL (ref 1.7–7.7)
Neutrophils Relative %: 77 %
PLATELETS: 314 10*3/uL (ref 150–400)
RBC: 2.94 MIL/uL — AB (ref 3.87–5.11)
RDW: 16 % — AB (ref 11.5–15.5)
WBC: 8 10*3/uL (ref 4.0–10.5)

## 2017-10-08 LAB — URINALYSIS, ROUTINE W REFLEX MICROSCOPIC
BILIRUBIN URINE: NEGATIVE
GLUCOSE, UA: NEGATIVE mg/dL
HGB URINE DIPSTICK: NEGATIVE
Ketones, ur: NEGATIVE mg/dL
NITRITE: POSITIVE — AB
PROTEIN: NEGATIVE mg/dL
Specific Gravity, Urine: 1.014 (ref 1.005–1.030)
Squamous Epithelial / LPF: NONE SEEN
pH: 5 (ref 5.0–8.0)

## 2017-10-08 LAB — POC OCCULT BLOOD, ED: Fecal Occult Bld: NEGATIVE

## 2017-10-08 LAB — BRAIN NATRIURETIC PEPTIDE: B Natriuretic Peptide: 79 pg/mL (ref 0.0–100.0)

## 2017-10-08 MED ORDER — DEXTROSE 5 % IV SOLN
1.0000 g | INTRAVENOUS | Status: DC
  Administered 2017-10-09 – 2017-10-10 (×2): 1 g via INTRAVENOUS
  Filled 2017-10-08 (×2): qty 10

## 2017-10-08 MED ORDER — SODIUM CHLORIDE 0.9 % IV SOLN
INTRAVENOUS | Status: AC
  Administered 2017-10-08: 20:00:00 via INTRAVENOUS

## 2017-10-08 MED ORDER — ACETAMINOPHEN 325 MG PO TABS
ORAL_TABLET | ORAL | Status: AC
  Administered 2017-10-09: 650 mg via ORAL
  Filled 2017-10-08: qty 2

## 2017-10-08 MED ORDER — THIAMINE HCL 100 MG/ML IJ SOLN
100.0000 mg | Freq: Once | INTRAMUSCULAR | Status: AC
  Administered 2017-10-08: 100 mg via INTRAVENOUS
  Filled 2017-10-08: qty 2

## 2017-10-08 MED ORDER — DEXTROSE 5 % IV SOLN
1.0000 g | Freq: Once | INTRAVENOUS | Status: AC
  Administered 2017-10-08: 1 g via INTRAVENOUS
  Filled 2017-10-08: qty 10

## 2017-10-08 MED ORDER — DEXTROSE 5 % IV SOLN
500.0000 mg | Freq: Once | INTRAVENOUS | Status: AC
  Administered 2017-10-08: 500 mg via INTRAVENOUS
  Filled 2017-10-08: qty 500

## 2017-10-08 MED ORDER — ACETAMINOPHEN 325 MG PO TABS
650.0000 mg | ORAL_TABLET | ORAL | Status: DC | PRN
  Administered 2017-10-08 – 2017-10-09 (×2): 650 mg via ORAL
  Filled 2017-10-08: qty 2

## 2017-10-08 MED ORDER — SODIUM CHLORIDE 0.9 % IV BOLUS (SEPSIS)
1000.0000 mL | Freq: Once | INTRAVENOUS | Status: AC
  Administered 2017-10-08: 1000 mL via INTRAVENOUS

## 2017-10-08 MED ORDER — APIXABAN 5 MG PO TABS
5.0000 mg | ORAL_TABLET | Freq: Two times a day (BID) | ORAL | Status: DC
  Administered 2017-10-08 – 2017-10-09 (×3): 5 mg via ORAL
  Filled 2017-10-08 (×4): qty 1

## 2017-10-08 MED ORDER — DEXTROSE 5 % IV SOLN
500.0000 mg | INTRAVENOUS | Status: DC
  Administered 2017-10-09 – 2017-10-10 (×2): 500 mg via INTRAVENOUS
  Filled 2017-10-08 (×2): qty 500

## 2017-10-08 NOTE — ED Triage Notes (Signed)
Pt reports generalized weakness and inability to walk for the past few days.  States she feels very dizzy and short of breath at times since last night.

## 2017-10-08 NOTE — ED Notes (Signed)
Pt is difficult stick and trouble obtaining blood cultures.  Blood obtained by RN with ultrasound.

## 2017-10-08 NOTE — ED Provider Notes (Signed)
[facility] EMERGENCY DEPARTMENT Provider Note   CSN: [id number] Arrival date & time: 10/08/17  1440     History   Chief Complaint Chief Complaint  Patient presents with  . Weakness    HPI Heather Miller is a 81 y.o. female.  HPI Complains of dizziness meaning lightheadedness and sensation of room spinning onset yesterday.  She is presently feeling lightheaded no longer feels room spinning symptoms accompanied by shortness of breath though not short of breath now.  She denies  chest pain cough or fever.  She denies noncompliance with her medication.  Patient has not walked in 1 month, stating she uses the bathroom wjhile wearing diapers.  Other associated symptoms include diminished appetite for the past 2 or 3 days. No Treatment prior to coming here nothing makes symptoms better or worse.  Past Medical History:  Diagnosis Date  . Arthritis   . Blind   . Complication of anesthesia   . Kidney stones   . Macular degeneration   . PONV (postoperative nausea and vomiting)   . Pulmonary emboli (HCC)   . Varicose vein of leg     Patient Active Problem List   Diagnosis Date Noted  . Acute on chronic diastolic heart failure (HCC) 04/24/2017  . Personal history of pulmonary embolism 04/24/2017  . CHF (congestive heart failure) (HCC) 04/21/2017  . Influenza 11/28/2016  . COPD (chronic obstructive pulmonary disease) (HCC) 11/12/2016  . Acute respiratory failure with hypoxia (HCC) 11/11/2016  . CAP (community acquired pneumonia) 11/11/2016  . Congestive heart failure (CHF) (HCC) 11/11/2016  . Hypokalemia 11/11/2016  . Macular degeneration (senile) of retina 07/15/2009  . CHEST PAIN 07/15/2009    Past Surgical History:  Procedure Laterality Date  . CYSTOSCOPY W/ URETERAL STENT PLACEMENT  05/10/2011   Procedure: CYSTOSCOPY WITH STENT REPLACEMENT;  Surgeon: Ky BarbanMohammad I Javaid;  Location: [facility];  Service: Urology;  Laterality: Bilateral;  . CYSTOSCOPY/RETROGRADE/URETEROSCOPY   05/10/2011   Procedure: CYSTOSCOPY/RETROGRADE/URETEROSCOPY;  Surgeon: Ky BarbanMohammad I Javaid;  Location: [facility];  Service: Urology;  Laterality: Bilateral;  no stone on right side  . EYE SURGERY  50's   detached retina  . KNEE ARTHROSCOPY     Right, [facility]    OB History    Gravida Para Term Preterm AB Living   3         3   SAB TAB Ectopic Multiple Live Births                   Home Medications    Prior to Admission medications   Medication Sig Start Date End Date Taking? Authorizing Provider  acetaminophen (TYLENOL) 325 MG tablet Take 650 mg by mouth every 4 (four) hours as needed for mild pain or moderate pain.     [provider]  apixaban (ELIQUIS) 5 MG TABS tablet Take 5 mg by mouth 2 (two) times daily.    [provider]  furosemide (LASIX) 40 MG tablet Take 1 tablet (40 mg total) by mouth 2 (two) times daily. 04/25/17   Kari BaarsHawkins, Edward, MD  OXYGEN Inhale 2 L into the lungs daily. Oxygen @@ 2 L /mn via every q shift     [provider]  potassium chloride SA (K-DUR,KLOR-CON) 20 MEQ tablet Take 20 mEq by mouth daily.    [provider]    Family History History reviewed. No pertinent family history.  Social History Social History   Tobacco Use  . Smoking status: Former Games developermoker  . Smokeless  tobacco: Never Used  Substance Use Topics  . Alcohol use: No  . Drug use: No     Allergies   Patient has no known allergies.   Review of Systems Review of Systems  Constitutional: Positive for appetite change.  Eyes: Positive for visual disturbance.       Blind  Respiratory: Positive for shortness of breath.   Cardiovascular: Positive for leg swelling.       Leg swelling since March 2018  Neurological: Positive for dizziness and weakness.     Physical Exam Updated Vital Signs BP 96/75 (BP Location: Left Arm)   Pulse (!) 101   Temp 98 F (36.7 C) (Oral)   Resp 18   SpO2 96%   Physical Exam  Constitutional:  Chronically  ill-appearing  HENT:  Head: Normocephalic and atraumatic.  Mucous membranes pale, dry  Eyes: EOM are normal.  Right cornea opacified  Neck: Neck supple. No tracheal deviation present. No thyromegaly present.  Cardiovascular: Normal rate and regular rhythm.  No murmur heard. Pulmonary/Chest: Effort normal and breath sounds normal.  Abdominal: Soft. Bowel sounds are normal. She exhibits no distension. There is no tenderness.  Genitourinary: Rectal exam shows guaiac negative stool.  Genitourinary Comments: Rectum normal tone Foots stool Hemoccult negative  Musculoskeletal: Normal range of motion. She exhibits edema. She exhibits no tenderness.  biLateral pretibial pitting edema  Neurological: She is alert. Coordination normal.  Alert awake oriented x3 moves all extremities well  Skin: Skin is warm and dry. No rash noted.  Stage I decubitus ulcer presacral area  Psychiatric: She has a normal mood and affect.  Nursing note and vitals reviewed.    ED Treatments / Results  Labs (all labs ordered are listed, but only abnormal results are displayed) Labs Reviewed - No data to display  EKG  EKG Interpretation  Date/Time:  Sunday October 08 2017 14:53:31 EST Ventricular Rate:  96 PR Interval:    QRS Duration: 93 QT Interval:  350 QTC Calculation: 443 R Axis:   -30 Text Interpretation:  Sinus rhythm Left axis deviation Posterior infarct, old Nonspecific T abnormalities, lateral leads lowe voltage compared to prior ECG No significant change since last tracing Reconfirmed by Doug Sou [phone]) on 10/08/2017 3:01:13 PM      Chest x-ray viewed by me Results for orders placed or performed during the hospital encounter of 10/08/17  CBC with Differential/Platelet  Result Value Ref Range   WBC 8.0 4.0 - 10.5 K/uL   RBC 2.94 (L) 3.87 - 5.11 MIL/uL   Hemoglobin 8.4 (L) 12.0 - 15.0 g/dL   HCT 60.4 (L) 54.0 - 98.1 %   MCV 88.4 78.0 - 100.0 fL   MCH 28.6 26.0 - 34.0 pg   MCHC 32.3  30.0 - 36.0 g/dL   RDW 19.1 (H) 47.8 - 29.5 %   Platelets 314 150 - 400 K/uL   Neutrophils Relative % 77 %   Neutro Abs 6.2 1.7 - 7.7 K/uL   Lymphocytes Relative 12 %   Lymphs Abs 1.0 0.7 - 4.0 K/uL   Monocytes Relative 10 %   Monocytes Absolute 0.8 0.1 - 1.0 K/uL   Eosinophils Relative 1 %   Eosinophils Absolute 0.1 0.0 - 0.7 K/uL   Basophils Relative 0 %   Basophils Absolute 0.0 0.0 - 0.1 K/uL  Comprehensive metabolic panel  Result Value Ref Range   Sodium 128 (L) 135 - 145 mmol/L   Potassium 5.0 3.5 - 5.1 mmol/L   Chloride 99 (L)  101 - 111 mmol/L   CO2 26 22 - 32 mmol/L   Glucose, Bld 95 65 - 99 mg/dL   BUN 21 (H) 6 - 20 mg/dL   Creatinine, Ser 1.611.39 (H) 0.44 - 1.00 mg/dL   Calcium 7.8 (L) 8.9 - 10.3 mg/dL   Total Protein 4.9 (L) 6.5 - 8.1 g/dL   Albumin 1.5 (L) 3.5 - 5.0 g/dL   AST 21 15 - 41 U/L   ALT 14 14 - 54 U/L   Alkaline Phosphatase 56 38 - 126 U/L   Total Bilirubin 0.4 0.3 - 1.2 mg/dL   GFR calc non Af Amer 35 (L) >60 mL/min   GFR calc Af Amer 41 (L) >60 mL/min   Anion gap 3 (L) 5 - 15  Urinalysis, Routine w reflex microscopic  Result Value Ref Range   Color, Urine YELLOW YELLOW   APPearance HAZY (A) CLEAR   Specific Gravity, Urine 1.014 1.005 - 1.030   pH 5.0 5.0 - 8.0   Glucose, UA NEGATIVE NEGATIVE mg/dL   Hgb urine dipstick NEGATIVE NEGATIVE   Bilirubin Urine NEGATIVE NEGATIVE   Ketones, ur NEGATIVE NEGATIVE mg/dL   Protein, ur NEGATIVE NEGATIVE mg/dL   Nitrite POSITIVE (A) NEGATIVE   Leukocytes, UA SMALL (A) NEGATIVE   RBC / HPF 0-5 0 - 5 RBC/hpf   WBC, UA 6-30 0 - 5 WBC/hpf   Bacteria, UA MANY (A) NONE SEEN   Squamous Epithelial / LPF NONE SEEN NONE SEEN   WBC Clumps PRESENT   Brain natriuretic peptide  Result Value Ref Range   B Natriuretic Peptide 79.0 0.0 - 100.0 pg/mL  POC occult blood, ED Provider will collect  Result Value Ref Range   Fecal Occult Bld NEGATIVE NEGATIVE   Dg Chest 1 View  Result Date: 10/08/2017 CLINICAL DATA:   Shortness of breath, generalized weakness EXAM: CHEST 1 VIEW COMPARISON:  04/12/2017 FINDINGS: Patchy airspace disease throughout the lungs bilaterally, left greater than right. Small loculated left lateral pleural effusion. Very low lung volumes. Heart is normal size. IMPRESSION: Patchy bilateral airspace opacities, left greater than right concerning for multifocal pneumonia. Small loculated left pleural effusion. Low lung volumes. Electronically Signed   By: Charlett NoseKevin  Dover M.D.   On: 10/08/2017 16:20   Radiology No results found.  Procedures Procedures (including critical care time)  Medications Ordered in ED Medications - No data to display   Initial Impression / Assessment and Plan / ED Course  I have reviewed the triage vital signs and the nursing notes.  Pertinent labs & imaging results that were available during my care of the patient were reviewed by me and considered in my medical decision making (see chart for details).     She is clinically dehydrated. She has chronic anemia however hemoglobin is lower than 2 months ago and I suspect will lower further after treatment with intravenous hydration.  She received normal saline 1 L intravenous bolus here.  As well as intravenous antibiotics for treatment of community acquired pneumonia.  She has chronic hyponatremia however slightly worse than 5 months ago.  I have consulted Dr. Glynis SmilesSha who will arrange for overnight stayh Final Clinical Impressions(s) / ED Diagnoses  Diagnoses #1 community acquired pneumonia #2 anemia #3 hyponatremia #4 chronic renal insufficiency #5 dehydration Final diagnoses:  None    ED Discharge Orders    None       Doug SouJacubowitz, Imberly Troxler, MD 10/08/17 1710

## 2017-10-08 NOTE — Progress Notes (Signed)
Patient has extensive MASD to the groin buttocks and sacral area.  Skin on the sacral area is sloughing off.

## 2017-10-08 NOTE — H&P (Signed)
History and Physical    Heather Miller ZOX:[id number] DOB: 01-05-1936 DOA: 10/08/2017  PCP: Kari Baars, MD   Patient coming from: Home  Chief Complaint: Dyspnea, lightheadedness/dizziness  HPI: Heather Miller is a 81 y.o. female with medical history significant for COPD on home oxygen, chronic diastolic heart failure, CKD III, history of PE on anticoagulation with Eliquis, debility, and macular degeneration who presented to the emergency department with worsening lightheadedness and dizziness along with shortness of breath.  She has also had a cough with yellowish sputum production over the last several days, but denies any fever or chills.  She has been taking her medications as prescribed and apparently is now nonambulatory and wears diapers at home according to the daughter at the bedside who lives next door to her, and takes care of her.  She has had a diminished appetite over the last 2-3 days with overall worsening weakness.  Of note, both the patient and daughter claimed that much of her primary care is done by Dr. Juanetta Gosling and that they have not seen Dr. Selena Batten who is on record as her primary care physician in "quite some time."  They would like to see Dr. Juanetta Gosling while here in the hospital.   ED Course: Vital signs are noted to be stable, but she does have some borderline hypotension and very mild tachycardia.  She is currently saturating at 96% on 2 L nasal cannula.  EKG demonstrates sinus rhythm with left axis deviation and posterior infarct.  Labs are remarkable for hemoglobin 8.4, hematocrit 26, sodium 128, chloride 99, BUN 21, and creatinine 1.39.  Review of Systems: As per HPI otherwise 10 point review of systems negative.   Past Medical History:  Diagnosis Date  . Arthritis   . Blind   . Complication of anesthesia   . Kidney stones   . Macular degeneration   . PONV (postoperative nausea and vomiting)   . Pulmonary emboli (HCC)   . Varicose vein of leg     Past Surgical  History:  Procedure Laterality Date  . CYSTOSCOPY W/ URETERAL STENT PLACEMENT  05/10/2011   Procedure: CYSTOSCOPY WITH STENT REPLACEMENT;  Surgeon: Ky Barban;  Location: [facility];  Service: Urology;  Laterality: Bilateral;  . CYSTOSCOPY/RETROGRADE/URETEROSCOPY  05/10/2011   Procedure: CYSTOSCOPY/RETROGRADE/URETEROSCOPY;  Surgeon: Ky Barban;  Location: [facility];  Service: Urology;  Laterality: Bilateral;  no stone on right side  . EYE SURGERY  50's   detached retina  . KNEE ARTHROSCOPY     Right, [facility]     reports that she has quit smoking. she has never used smokeless tobacco. She reports that she does not drink alcohol or use drugs.  No Known Allergies  History reviewed. No pertinent family history.  Prior to Admission medications   Medication Sig Start Date End Date Taking? Authorizing Provider  acetaminophen (TYLENOL) 325 MG tablet Take 650 mg by mouth every 4 (four) hours as needed for mild pain or moderate pain.     [provider]  apixaban (ELIQUIS) 5 MG TABS tablet Take 5 mg by mouth 2 (two) times daily.    [provider]  furosemide (LASIX) 40 MG tablet Take 1 tablet (40 mg total) by mouth 2 (two) times daily. 04/25/17   Kari Baars, MD  OXYGEN Inhale 2 L into the lungs daily. Oxygen @@ 2 L /mn via every q shift     [provider]  potassium chloride SA (K-DUR,KLOR-CON) 20 MEQ tablet Take 20 mEq  by mouth daily.    [provider]    Physical Exam: Vitals:   10/08/17 1630 10/08/17 1700 10/08/17 1730 10/08/17 1800  BP: 104/63 103/63 100/63 93/69  Pulse: 88 92 85   Resp: (!) 22 18 17 17   Temp:      TempSrc:      SpO2: 100% 100% 99%     Constitutional: NAD, calm, comfortable Vitals:   10/08/17 1630 10/08/17 1700 10/08/17 1730 10/08/17 1800  BP: 104/63 103/63 100/63 93/69  Pulse: 88 92 85   Resp: (!) 22 18 17 17   Temp:      TempSrc:      SpO2: 100% 100% 99%    Eyes: lids and conjunctivae normal; R eye  shut ENMT: Mucous membranes are moist.  Neck: normal, supple Respiratory: clear to auscultation bilaterally. Normal respiratory effort. No accessory muscle use. On 2L [place]. Cardiovascular: Regular rate and rhythm, no murmurs. No extremity edema. Abdomen: no tenderness, no distention. Bowel sounds positive.  Musculoskeletal:  No joint deformity upper and lower extremities.   Skin: no rashes, lesions, ulcers.  Psychiatric: Normal judgment and insight. Alert and oriented x 3. Normal mood.   Labs on Admission: I have personally reviewed following labs and imaging studies  CBC: Recent Labs  Lab 10/08/17 1522  WBC 8.0  NEUTROABS 6.2  HGB 8.4*  HCT 26.0*  MCV 88.4  PLT 314   Basic Metabolic Panel: Recent Labs  Lab 10/08/17 1522  NA 128*  K 5.0  CL 99*  CO2 26  GLUCOSE 95  BUN 21*  CREATININE 1.39*  CALCIUM 7.8*   GFR: CrCl cannot be calculated (Unknown ideal weight.). Liver Function Tests: Recent Labs  Lab 10/08/17 1522  AST 21  ALT 14  ALKPHOS 56  BILITOT 0.4  PROT 4.9*  ALBUMIN 1.5*   No results for input(s): LIPASE, AMYLASE in the last 168 hours. No results for input(s): AMMONIA in the last 168 hours. Coagulation Profile: No results for input(s): INR, PROTIME in the last 168 hours. Cardiac Enzymes: No results for input(s): CKTOTAL, CKMB, CKMBINDEX, TROPONINI in the last 168 hours. BNP (last 3 results) No results for input(s): PROBNP in the last 8760 hours. HbA1C: No results for input(s): HGBA1C in the last 72 hours. CBG: No results for input(s): GLUCAP in the last 168 hours. Lipid Profile: No results for input(s): CHOL, HDL, LDLCALC, TRIG, CHOLHDL, LDLDIRECT in the last 72 hours. Thyroid Function Tests: No results for input(s): TSH, T4TOTAL, FREET4, T3FREE, THYROIDAB in the last 72 hours. Anemia Panel: No results for input(s): VITAMINB12, FOLATE, FERRITIN, TIBC, IRON, RETICCTPCT in the last 72 hours. Urine analysis:    Component Value Date/Time    COLORURINE YELLOW 10/08/2017 1513   APPEARANCEUR HAZY (A) 10/08/2017 1513   LABSPEC 1.014 10/08/2017 1513   PHURINE 5.0 10/08/2017 1513   GLUCOSEU NEGATIVE 10/08/2017 1513   HGBUR NEGATIVE 10/08/2017 1513   BILIRUBINUR NEGATIVE 10/08/2017 1513   KETONESUR NEGATIVE 10/08/2017 1513   PROTEINUR NEGATIVE 10/08/2017 1513   UROBILINOGEN 1.0 04/05/2011 1655   NITRITE POSITIVE (A) 10/08/2017 1513   LEUKOCYTESUR SMALL (A) 10/08/2017 1513    Radiological Exams on Admission: Dg Chest 1 View  Result Date: 10/08/2017 CLINICAL DATA:  Shortness of breath, generalized weakness EXAM: CHEST 1 VIEW COMPARISON:  04/12/2017 FINDINGS: Patchy airspace disease throughout the lungs bilaterally, left greater than right. Small loculated left lateral pleural effusion. Very low lung volumes. Heart is normal size. IMPRESSION: Patchy bilateral airspace opacities, left greater than right concerning  for multifocal pneumonia. Small loculated left pleural effusion. Low lung volumes. Electronically Signed   By: Charlett NoseKevin  Dover M.D.   On: 10/08/2017 16:20    EKG: Independently reviewed. As above.  Assessment/Plan Principal Problem:   CAP (community acquired pneumonia) Active Problems:   Macular degeneration (senile) of retina   Congestive heart failure (CHF) (HCC)   COPD (chronic obstructive pulmonary disease) (HCC)   Personal history of pulmonary embolism   Dehydration   Decubitus ulcer of sacral region, stage 1   Pneumonia    Patchy bilateral airspace opacities-suggestive of community-acquired pneumonia Continue Rocephin and azithromycin empirically Sputum cultures Urine strep pneumonia and Legionella Robitussin as needed for symptom management  COPD with chronic hypoxemia Duo nebs as needed Continue oxygen  Anemia -FOBT negative in ED Iron studies B12, Folate  Chronic diastolic congestive heart failure -Patient appears to have some dehydration with hyponatremia Gentle IV fluid replacement Monitor  I's and O's Daily weights Lasix held  Sacral decubitus ulcer-stage I Wound care  History of PE Continue anticoagulation with Eliquis   DVT prophylaxis: Eliquis Code Status: Full Family Communication: Daughter at bedside Disposition Plan:Home with possible home health in am Consults called:None Admission status: Obs, med-surg   Britney Captain Hoover BrunetteD Ryland Tungate DO [facility] Pager [phone]  If 7PM-7AM, please contact night-coverage [email or web address] Password [email or web address]  10/08/2017, 8:27 PM

## 2017-10-09 DIAGNOSIS — R0602 Shortness of breath: Secondary | ICD-10-CM | POA: Diagnosis present

## 2017-10-09 DIAGNOSIS — H3530 Unspecified macular degeneration: Secondary | ICD-10-CM | POA: Diagnosis present

## 2017-10-09 DIAGNOSIS — J189 Pneumonia, unspecified organism: Secondary | ICD-10-CM | POA: Diagnosis not present

## 2017-10-09 DIAGNOSIS — R278 Other lack of coordination: Secondary | ICD-10-CM | POA: Diagnosis not present

## 2017-10-09 DIAGNOSIS — R2681 Unsteadiness on feet: Secondary | ICD-10-CM | POA: Diagnosis not present

## 2017-10-09 DIAGNOSIS — I5032 Chronic diastolic (congestive) heart failure: Secondary | ICD-10-CM | POA: Diagnosis present

## 2017-10-09 DIAGNOSIS — J154 Pneumonia due to other streptococci: Secondary | ICD-10-CM | POA: Diagnosis present

## 2017-10-09 DIAGNOSIS — D638 Anemia in other chronic diseases classified elsewhere: Secondary | ICD-10-CM | POA: Diagnosis present

## 2017-10-09 DIAGNOSIS — L89151 Pressure ulcer of sacral region, stage 1: Secondary | ICD-10-CM | POA: Diagnosis present

## 2017-10-09 DIAGNOSIS — J9601 Acute respiratory failure with hypoxia: Secondary | ICD-10-CM | POA: Diagnosis present

## 2017-10-09 DIAGNOSIS — J440 Chronic obstructive pulmonary disease with acute lower respiratory infection: Secondary | ICD-10-CM | POA: Diagnosis present

## 2017-10-09 DIAGNOSIS — E871 Hypo-osmolality and hyponatremia: Secondary | ICD-10-CM | POA: Diagnosis present

## 2017-10-09 DIAGNOSIS — H548 Legal blindness, as defined in USA: Secondary | ICD-10-CM | POA: Diagnosis present

## 2017-10-09 DIAGNOSIS — Z87891 Personal history of nicotine dependence: Secondary | ICD-10-CM | POA: Diagnosis not present

## 2017-10-09 DIAGNOSIS — Z86711 Personal history of pulmonary embolism: Secondary | ICD-10-CM | POA: Diagnosis not present

## 2017-10-09 DIAGNOSIS — Z7901 Long term (current) use of anticoagulants: Secondary | ICD-10-CM | POA: Diagnosis not present

## 2017-10-09 DIAGNOSIS — E860 Dehydration: Secondary | ICD-10-CM | POA: Diagnosis present

## 2017-10-09 DIAGNOSIS — Z9981 Dependence on supplemental oxygen: Secondary | ICD-10-CM | POA: Diagnosis not present

## 2017-10-09 DIAGNOSIS — R1312 Dysphagia, oropharyngeal phase: Secondary | ICD-10-CM | POA: Diagnosis not present

## 2017-10-09 DIAGNOSIS — Z79899 Other long term (current) drug therapy: Secondary | ICD-10-CM | POA: Diagnosis not present

## 2017-10-09 DIAGNOSIS — M6281 Muscle weakness (generalized): Secondary | ICD-10-CM | POA: Diagnosis not present

## 2017-10-09 LAB — BASIC METABOLIC PANEL
BUN: 20 mg/dL (ref 6–20)
CHLORIDE: 99 mmol/L — AB (ref 101–111)
CO2: 27 mmol/L (ref 22–32)
Calcium: 7.5 mg/dL — ABNORMAL LOW (ref 8.9–10.3)
Creatinine, Ser: 1.36 mg/dL — ABNORMAL HIGH (ref 0.44–1.00)
GFR, EST AFRICAN AMERICAN: 42 mL/min — AB (ref >60)
GFR, EST NON AFRICAN AMERICAN: 36 mL/min — AB (ref >60)
Glucose, Bld: 79 mg/dL (ref 65–99)
POTASSIUM: 4.9 mmol/L (ref 3.5–5.1)
Sodium: 128 mmol/L — ABNORMAL LOW (ref 135–145)

## 2017-10-09 LAB — VITAMIN B12: VITAMIN B 12: 468 pg/mL (ref 180–914)

## 2017-10-09 LAB — CBC
HEMATOCRIT: 23 % — AB (ref 36.0–46.0)
HEMOGLOBIN: 7.3 g/dL — AB (ref 12.0–15.0)
MCH: 27.9 pg (ref 26.0–34.0)
MCHC: 31.7 g/dL (ref 30.0–36.0)
MCV: 87.8 fL (ref 78.0–100.0)
Platelets: 308 10*3/uL (ref 150–400)
RBC: 2.62 MIL/uL — ABNORMAL LOW (ref 3.87–5.11)
RDW: 16.2 % — ABNORMAL HIGH (ref 11.5–15.5)
WBC: 5.5 10*3/uL (ref 4.0–10.5)

## 2017-10-09 LAB — FERRITIN: FERRITIN: 401 ng/mL — AB (ref 11–307)

## 2017-10-09 LAB — HIV ANTIBODY (ROUTINE TESTING W REFLEX): HIV SCREEN 4TH GENERATION: NONREACTIVE

## 2017-10-09 LAB — IRON AND TIBC: Iron: 39 ug/dL (ref 28–170)

## 2017-10-09 MED ORDER — SODIUM CHLORIDE 0.9 % IV SOLN
INTRAVENOUS | Status: AC
  Administered 2017-10-09: 12:00:00 via INTRAVENOUS

## 2017-10-09 NOTE — Care Management Note (Signed)
Case Management Note  Patient Details  Name: Heather GarrisonSarah P Miller MRN: [id number] Date of Birth: 05/26/1936  Subjective/Objective:     Admitted with CAP. Pt is from home, lives alone, has a dtr that lives next door. Pt is blind. She is unable to walk and stays in the bed most of the day. She has multiple sores on her from being immobile. She has a WC she uses to go to appointments. She says her dtr comes over mult times a day. She prepares her meals and takes her to appointments. Pt says she prepares her own medications, she can distinguish between the different bottles and difference pills. Pt only have 4 medications on her pta list so she may be able to accurately tell the difference between the different pills. Pt planning to return home at DC. [facility]  In the [facility], [facility] but [facility] not [facility]. [facility] but is not sure. Pt has home oxygen pta but no neb machine. She has been used neb tx in the past though. Pt says she wants to DC home, [facility] if recommended. Would maybe be agreeable to SNF if needed depending on where she would be going. Pt gives permission for me to contact her dtr to verify dtr has same DC plan. Dtr is not POA but pt says they are working on making her POA. Pt has 3 dtrs.                Action/Plan: CM has left VM for [employer]. Bonita QuinLinda, [facility], has been asked to verify if they have been previously active with pt. Due to patient being unanble to ambulated at baseline it is doubtful pt would benefit from SNF. Anticipate HH is most appropriate option. CM will cont to follow.   Expected Discharge Date:  10/09/17               Expected Discharge Plan:  Home w Home Health Services  In-House Referral:  NA  Discharge planning Services  CM Consult  Post Acute Care Choice:  Home Health Choice offered to:  Patient  HH Arranged:  [facility], [facility]  Status  of Service:  In process, will continue to follow  If discussed at Long Length of Stay Meetings, dates discussed:    Additional Comments:  Malcolm MetroChildress, Tyce Delcid Demske, RN 10/09/2017, 12:41 PM

## 2017-10-09 NOTE — Progress Notes (Signed)
Subjective: She was admitted with pneumonia.  She is feeling a little better she says.  She is coughing up some sputum.  She is not having any chest pain.  No overt symptoms of heart failure.  Objective: Vital signs in last 24 hours: Temp:  [97.7 F (36.5 C)-98 F (36.7 C)] 97.7 F (36.5 C) (12/17 0616) Pulse Rate:  [85-101] 85 (12/17 0616) Resp:  [17-23] 18 (12/17 0616) BP: (93-104)/(58-75) 95/58 (12/17 0616) SpO2:  [96 %-100 %] 100 % (12/17 0616) Weight:  [83.1 kg (183 lb 3.2 oz)-85.3 kg (188 lb)] 83.1 kg (183 lb 3.2 oz) (12/17 0616) Weight change:  Last BM Date: 10/05/17  Intake/Output from previous day: 12/16 0701 - 12/17 0700 In: 1673.8 [P.O.:240; I.V.:383.8; IV Piggyback:1050] Out: -   PHYSICAL EXAM General appearance: alert, cooperative and mild distress Resp: rhonchi bilaterally Cardio: regular rate and rhythm, S1, S2 normal, no murmur, click, rub or gallop GI: soft, non-tender; bowel sounds normal; no masses,  no organomegaly Extremities: 1+ edema She is legally blind.  She has decubitus on her sacral area which is stage I  Lab Results:  Results for orders placed or performed during the hospital encounter of 10/08/17 (from the past 48 hour(s))  Urinalysis, Routine w reflex microscopic     Status: Abnormal   Collection Time: 10/08/17  3:13 PM  Result Value Ref Range   Color, Urine YELLOW YELLOW   APPearance HAZY (A) CLEAR   Specific Gravity, Urine 1.014 1.005 - 1.030   pH 5.0 5.0 - 8.0   Glucose, UA NEGATIVE NEGATIVE mg/dL   Hgb urine dipstick NEGATIVE NEGATIVE   Bilirubin Urine NEGATIVE NEGATIVE   Ketones, ur NEGATIVE NEGATIVE mg/dL   Protein, ur NEGATIVE NEGATIVE mg/dL   Nitrite POSITIVE (A) NEGATIVE   Leukocytes, UA SMALL (A) NEGATIVE   RBC / HPF 0-5 0 - 5 RBC/hpf   WBC, UA 6-30 0 - 5 WBC/hpf   Bacteria, UA MANY (A) NONE SEEN   Squamous Epithelial / LPF NONE SEEN NONE SEEN   WBC Clumps PRESENT   Strep pneumoniae urinary antigen     Status: None   Collection Time: 10/08/17  3:13 PM  Result Value Ref Range   Strep Pneumo Urinary Antigen NEGATIVE NEGATIVE    Comment:        Infection due to S. pneumoniae cannot be absolutely ruled out since the antigen present may be below the detection limit of the test. Performed at [facility], [address], [place], [place] [address]   CBC with Differential/Platelet     Status: Abnormal   Collection Time: 10/08/17  3:22 PM  Result Value Ref Range   WBC 8.0 4.0 - 10.5 K/uL   RBC 2.94 (L) 3.87 - 5.11 MIL/uL   Hemoglobin 8.4 (L) 12.0 - 15.0 g/dL   HCT 26.0 (L) 36.0 - 46.0 %   MCV 88.4 78.0 - 100.0 fL   MCH 28.6 26.0 - 34.0 pg   MCHC 32.3 30.0 - 36.0 g/dL   RDW 16.0 (H) 11.5 - 15.5 %   Platelets 314 150 - 400 K/uL   Neutrophils Relative % 77 %   Neutro Abs 6.2 1.7 - 7.7 K/uL   Lymphocytes Relative 12 %   Lymphs Abs 1.0 0.7 - 4.0 K/uL   Monocytes Relative 10 %   Monocytes Absolute 0.8 0.1 - 1.0 K/uL   Eosinophils Relative 1 %   Eosinophils Absolute 0.1 0.0 - 0.7 K/uL   Basophils Relative 0 %  Basophils Absolute 0.0 0.0 - 0.1 K/uL  Comprehensive metabolic panel     Status: Abnormal   Collection Time: 10/08/17  3:22 PM  Result Value Ref Range   Sodium 128 (L) 135 - 145 mmol/L   Potassium 5.0 3.5 - 5.1 mmol/L   Chloride 99 (L) 101 - 111 mmol/L   CO2 26 22 - 32 mmol/L   Glucose, Bld 95 65 - 99 mg/dL   BUN 21 (H) 6 - 20 mg/dL   Creatinine, Ser 1.39 (H) 0.44 - 1.00 mg/dL   Calcium 7.8 (L) 8.9 - 10.3 mg/dL   Total Protein 4.9 (L) 6.5 - 8.1 g/dL   Albumin 1.5 (L) 3.5 - 5.0 g/dL   AST 21 15 - 41 U/L   ALT 14 14 - 54 U/L   Alkaline Phosphatase 56 38 - 126 U/L   Total Bilirubin 0.4 0.3 - 1.2 mg/dL   GFR calc non Af Amer 35 (L) >60 mL/min   GFR calc Af Amer 41 (L) >60 mL/min    Comment: (NOTE) The eGFR has been calculated using the CKD EPI equation. This calculation has not been validated in all clinical situations. eGFR's persistently <60 mL/min signify possible Chronic  Kidney Disease.    Anion gap 3 (L) 5 - 15  Brain natriuretic peptide     Status: None   Collection Time: 10/08/17  3:22 PM  Result Value Ref Range   B Natriuretic Peptide 79.0 0.0 - 100.0 pg/mL  POC occult blood, ED Provider will collect     Status: None   Collection Time: 10/08/17  3:30 PM  Result Value Ref Range   Fecal Occult Bld NEGATIVE NEGATIVE  Culture, blood (Routine X 2) w Reflex to ID Panel     Status: None (Preliminary result)   Collection Time: 10/08/17  5:42 PM  Result Value Ref Range   Specimen Description RIGHT ANTECUBITAL    Special Requests      BOTTLES DRAWN AEROBIC AND ANAEROBIC Blood Culture adequate volume   Culture NO GROWTH < 24 HOURS    Report Status PENDING   Culture, blood (Routine X 2) w Reflex to ID Panel     Status: None (Preliminary result)   Collection Time: 10/08/17  5:43 PM  Result Value Ref Range   Specimen Description BLOOD LEFT ARM    Special Requests      BOTTLES DRAWN AEROBIC AND ANAEROBIC Blood Culture adequate volume   Culture NO GROWTH < 24 HOURS    Report Status PENDING   Basic metabolic panel     Status: Abnormal   Collection Time: 10/09/17  5:49 AM  Result Value Ref Range   Sodium 128 (L) 135 - 145 mmol/L   Potassium 4.9 3.5 - 5.1 mmol/L   Chloride 99 (L) 101 - 111 mmol/L   CO2 27 22 - 32 mmol/L   Glucose, Bld 79 65 - 99 mg/dL   BUN 20 6 - 20 mg/dL   Creatinine, Ser 1.36 (H) 0.44 - 1.00 mg/dL   Calcium 7.5 (L) 8.9 - 10.3 mg/dL   GFR calc non Af Amer 36 (L) >60 mL/min   GFR calc Af Amer 42 (L) >60 mL/min    Comment: (NOTE) The eGFR has been calculated using the CKD EPI equation. This calculation has not been validated in all clinical situations. eGFR's persistently <60 mL/min signify possible Chronic Kidney Disease.   CBC     Status: Abnormal   Collection Time: 10/09/17  5:49 AM  Result Value  Ref Range   WBC 5.5 4.0 - 10.5 K/uL   RBC 2.62 (L) 3.87 - 5.11 MIL/uL   Hemoglobin 7.3 (L) 12.0 - 15.0 g/dL   HCT 23.0 (L) 36.0 -  46.0 %   MCV 87.8 78.0 - 100.0 fL   MCH 27.9 26.0 - 34.0 pg   MCHC 31.7 30.0 - 36.0 g/dL   RDW 16.2 (H) 11.5 - 15.5 %   Platelets 308 150 - 400 K/uL    ABGS No results for input(s): PHART, PO2ART, TCO2, HCO3 in the last 72 hours.  Invalid input(s): PCO2 CULTURES Recent Results (from the past 240 hour(s))  Culture, blood (Routine X 2) w Reflex to ID Panel     Status: None (Preliminary result)   Collection Time: 10/08/17  5:42 PM  Result Value Ref Range Status   Specimen Description RIGHT ANTECUBITAL  Final   Special Requests   Final    BOTTLES DRAWN AEROBIC AND ANAEROBIC Blood Culture adequate volume   Culture NO GROWTH < 24 HOURS  Final   Report Status PENDING  Incomplete  Culture, blood (Routine X 2) w Reflex to ID Panel     Status: None (Preliminary result)   Collection Time: 10/08/17  5:43 PM  Result Value Ref Range Status   Specimen Description BLOOD LEFT ARM  Final   Special Requests   Final    BOTTLES DRAWN AEROBIC AND ANAEROBIC Blood Culture adequate volume   Culture NO GROWTH < 24 HOURS  Final   Report Status PENDING  Incomplete   Studies/Results: Dg Chest 1 View  Result Date: 10/08/2017 CLINICAL DATA:  Shortness of breath, generalized weakness EXAM: CHEST 1 VIEW COMPARISON:  04/12/2017 FINDINGS: Patchy airspace disease throughout the lungs bilaterally, left greater than right. Small loculated left lateral pleural effusion. Very low lung volumes. Heart is normal size. IMPRESSION: Patchy bilateral airspace opacities, left greater than right concerning for multifocal pneumonia. Small loculated left pleural effusion. Low lung volumes. Electronically Signed   By: Rolm Baptise M.D.   On: 10/08/2017 16:20    Medications:  Prior to Admission:  Medications Prior to Admission  Medication Sig Dispense Refill Last Dose  . acetaminophen (TYLENOL) 325 MG tablet Take 650 mg by mouth every 4 (four) hours as needed for mild pain or moderate pain.    Past Week at Unknown time  .  apixaban (ELIQUIS) 5 MG TABS tablet Take 5 mg by mouth 2 (two) times daily.   04/21/2017 at [facility]  . furosemide (LASIX) 40 MG tablet Take 1 tablet (40 mg total) by mouth 2 (two) times daily. 60 tablet 5   . OXYGEN Inhale 2 L into the lungs daily. Oxygen @@ 2 L /mn via every q shift    04/21/2017 at Unknown time  . potassium chloride SA (K-DUR,KLOR-CON) 20 MEQ tablet Take 20 mEq by mouth daily.   04/19/2017 at Unknown time   Scheduled: . apixaban  5 mg Oral BID   Continuous: . azithromycin    . cefTRIAXone (ROCEPHIN)  IV     ZOX:[id number]  Assesment: She was admitted with community-acquired pneumonia.  At baseline she has COPD and is having an exacerbation with this.  She has congestive heart failure also and that seems to be doing pretty well.  She has had previous pulmonary embolism and has just recently been able to discontinue Eliquis. Principal Problem:   CAP (community acquired pneumonia) Active Problems:   Macular degeneration (senile) of retina   Congestive heart failure (CHF) ([place])  COPD (chronic obstructive pulmonary disease) ([facility])   Personal history of pulmonary embolism   Dehydration   Decubitus ulcer of sacral region, stage 1   Pneumonia    Plan: Continue current treatments    LOS: 0 days   Elzabeth Mcquerry L 10/09/2017, 8:49 AM

## 2017-10-10 LAB — FOLATE RBC
FOLATE, HEMOLYSATE: 206.7 ng/mL
Folate, RBC: 907 ng/mL (ref >498)
Hematocrit: 22.8 % — ABNORMAL LOW (ref 34.0–46.6)

## 2017-10-10 MED ORDER — AZITHROMYCIN 250 MG PO TABS
500.0000 mg | ORAL_TABLET | Freq: Once | ORAL | Status: AC
  Administered 2017-10-10: 500 mg via ORAL
  Filled 2017-10-10: qty 2

## 2017-10-10 MED ORDER — SODIUM CHLORIDE 0.9 % IV SOLN
INTRAVENOUS | Status: DC
  Administered 2017-10-10: 15:00:00 via INTRAVENOUS

## 2017-10-10 NOTE — Progress Notes (Signed)
Subjective: She says she feels a little better.  She has no new complaints.  Her breathing is better.  No chest pain.  No nausea or vomiting.  Objective: Vital signs in last 24 hours: Temp:  [97.5 F (36.4 C)-97.7 F (36.5 C)] 97.5 F (36.4 C) (12/18 0528) Pulse Rate:  [78-88] 88 (12/18 0528) Resp:  [16] 16 (12/18 0528) BP: (88-102)/(59-74) 102/74 (12/18 0528) SpO2:  [94 %-100 %] 100 % (12/18 0528) Weight:  [88.2 kg (194 lb 6.4 oz)] 88.2 kg (194 lb 6.4 oz) (12/18 0528) Weight change: 2.903 kg (6 lb 6.4 oz) Last BM Date: 10/07/17  Intake/Output from previous day: 12/17 0701 - 12/18 0700 In: 9381 [P.O.:700; I.V.:735] Out: 400 [Urine:400]  PHYSICAL EXAM General appearance: alert, cooperative and no distress Resp: rhonchi bilaterally Cardio: regular rate and rhythm, S1, S2 normal, no murmur, click, rub or gallop GI: soft, non-tender; bowel sounds normal; no masses,  no organomegaly Extremities: 1+ edema Decubitus on her sacrum is unchanged  Lab Results:  Results for orders placed or performed during the hospital encounter of 10/08/17 (from the past 48 hour(s))  Urinalysis, Routine w reflex microscopic     Status: Abnormal   Collection Time: 10/08/17  3:13 PM  Result Value Ref Range   Color, Urine YELLOW YELLOW   APPearance HAZY (A) CLEAR   Specific Gravity, Urine 1.014 1.005 - 1.030   pH 5.0 5.0 - 8.0   Glucose, UA NEGATIVE NEGATIVE mg/dL   Hgb urine dipstick NEGATIVE NEGATIVE   Bilirubin Urine NEGATIVE NEGATIVE   Ketones, ur NEGATIVE NEGATIVE mg/dL   Protein, ur NEGATIVE NEGATIVE mg/dL   Nitrite POSITIVE (A) NEGATIVE   Leukocytes, UA SMALL (A) NEGATIVE   RBC / HPF 0-5 0 - 5 RBC/hpf   WBC, UA 6-30 0 - 5 WBC/hpf   Bacteria, UA MANY (A) NONE SEEN   Squamous Epithelial / LPF NONE SEEN NONE SEEN   WBC Clumps PRESENT   Strep pneumoniae urinary antigen     Status: None   Collection Time: 10/08/17  3:13 PM  Result Value Ref Range   Strep Pneumo Urinary Antigen NEGATIVE  NEGATIVE    Comment:        Infection due to S. pneumoniae cannot be absolutely ruled out since the antigen present may be below the detection limit of the test. Performed at [facility], [address], [place], [place] [address]   CBC with Differential/Platelet     Status: Abnormal   Collection Time: 10/08/17  3:22 PM  Result Value Ref Range   WBC 8.0 4.0 - 10.5 K/uL   RBC 2.94 (L) 3.87 - 5.11 MIL/uL   Hemoglobin 8.4 (L) 12.0 - 15.0 g/dL   HCT 26.0 (L) 36.0 - 46.0 %   MCV 88.4 78.0 - 100.0 fL   MCH 28.6 26.0 - 34.0 pg   MCHC 32.3 30.0 - 36.0 g/dL   RDW 16.0 (H) 11.5 - 15.5 %   Platelets 314 150 - 400 K/uL   Neutrophils Relative % 77 %   Neutro Abs 6.2 1.7 - 7.7 K/uL   Lymphocytes Relative 12 %   Lymphs Abs 1.0 0.7 - 4.0 K/uL   Monocytes Relative 10 %   Monocytes Absolute 0.8 0.1 - 1.0 K/uL   Eosinophils Relative 1 %   Eosinophils Absolute 0.1 0.0 - 0.7 K/uL   Basophils Relative 0 %   Basophils Absolute 0.0 0.0 - 0.1 K/uL  Comprehensive metabolic panel     Status: Abnormal  Collection Time: 10/08/17  3:22 PM  Result Value Ref Range   Sodium 128 (L) 135 - 145 mmol/L   Potassium 5.0 3.5 - 5.1 mmol/L   Chloride 99 (L) 101 - 111 mmol/L   CO2 26 22 - 32 mmol/L   Glucose, Bld 95 65 - 99 mg/dL   BUN 21 (H) 6 - 20 mg/dL   Creatinine, Ser 1.39 (H) 0.44 - 1.00 mg/dL   Calcium 7.8 (L) 8.9 - 10.3 mg/dL   Total Protein 4.9 (L) 6.5 - 8.1 g/dL   Albumin 1.5 (L) 3.5 - 5.0 g/dL   AST 21 15 - 41 U/L   ALT 14 14 - 54 U/L   Alkaline Phosphatase 56 38 - 126 U/L   Total Bilirubin 0.4 0.3 - 1.2 mg/dL   GFR calc non Af Amer 35 (L) >60 mL/min   GFR calc Af Amer 41 (L) >60 mL/min    Comment: (NOTE) The eGFR has been calculated using the CKD EPI equation. This calculation has not been validated in all clinical situations. eGFR's persistently <60 mL/min signify possible Chronic Kidney Disease.    Anion gap 3 (L) 5 - 15  Brain natriuretic peptide     Status: None   Collection  Time: 10/08/17  3:22 PM  Result Value Ref Range   B Natriuretic Peptide 79.0 0.0 - 100.0 pg/mL  POC occult blood, ED Provider will collect     Status: None   Collection Time: 10/08/17  3:30 PM  Result Value Ref Range   Fecal Occult Bld NEGATIVE NEGATIVE  Culture, blood (Routine X 2) w Reflex to ID Panel     Status: None (Preliminary result)   Collection Time: 10/08/17  5:42 PM  Result Value Ref Range   Specimen Description RIGHT ANTECUBITAL    Special Requests      BOTTLES DRAWN AEROBIC AND ANAEROBIC Blood Culture adequate volume   Culture NO GROWTH < 24 HOURS    Report Status PENDING   Culture, blood (Routine X 2) w Reflex to ID Panel     Status: None (Preliminary result)   Collection Time: 10/08/17  5:43 PM  Result Value Ref Range   Specimen Description BLOOD LEFT ARM    Special Requests      BOTTLES DRAWN AEROBIC AND ANAEROBIC Blood Culture adequate volume   Culture NO GROWTH < 24 HOURS    Report Status PENDING   HIV antibody     Status: None   Collection Time: 10/08/17  5:53 PM  Result Value Ref Range   HIV Screen 4th Generation wRfx Non Reactive Non Reactive    Comment: (NOTE) Performed At: [facility] [place], [place] [id number] Rush Farmer MD RW:[id number]   Basic metabolic panel     Status: Abnormal   Collection Time: 10/09/17  5:49 AM  Result Value Ref Range   Sodium 128 (L) 135 - 145 mmol/L   Potassium 4.9 3.5 - 5.1 mmol/L   Chloride 99 (L) 101 - 111 mmol/L   CO2 27 22 - 32 mmol/L   Glucose, Bld 79 65 - 99 mg/dL   BUN 20 6 - 20 mg/dL   Creatinine, Ser 1.36 (H) 0.44 - 1.00 mg/dL   Calcium 7.5 (L) 8.9 - 10.3 mg/dL   GFR calc non Af Amer 36 (L) >60 mL/min   GFR calc Af Amer 42 (L) >60 mL/min    Comment: (NOTE) The eGFR has been calculated using the CKD EPI equation. This calculation has not  been validated in all clinical situations. eGFR's persistently <60 mL/min signify possible Chronic Kidney Disease.   CBC     Status: Abnormal    Collection Time: 10/09/17  5:49 AM  Result Value Ref Range   WBC 5.5 4.0 - 10.5 K/uL   RBC 2.62 (L) 3.87 - 5.11 MIL/uL   Hemoglobin 7.3 (L) 12.0 - 15.0 g/dL   HCT 23.0 (L) 36.0 - 46.0 %   MCV 87.8 78.0 - 100.0 fL   MCH 27.9 26.0 - 34.0 pg   MCHC 31.7 30.0 - 36.0 g/dL   RDW 16.2 (H) 11.5 - 15.5 %   Platelets 308 150 - 400 K/uL  Iron and TIBC     Status: None   Collection Time: 10/09/17  5:50 AM  Result Value Ref Range   Iron 39 28 - 170 ug/dL   TIBC NOT CALCULATED 250 - 450 ug/dL    Comment: TRANSFERRIN <70   Saturation Ratios NOT CALCULATED 10.4 - 31.8 %    Comment: TRANSFERRIN <70   UIBC NOT CALCULATED ug/dL    Comment: TRANSFERRIN <70 Performed at [facility], [place] [place], [place] [address]   Ferritin     Status: Abnormal   Collection Time: 10/09/17  5:50 AM  Result Value Ref Range   Ferritin 401 (H) 11 - 307 ng/mL    Comment: Performed at [facility], [place] [address], [place], [place] [address]  Vitamin B12     Status: None   Collection Time: 10/09/17  5:50 AM  Result Value Ref Range   Vitamin B-12 468 180 - 914 pg/mL    Comment: Performed at [facility] [address], [place], [place] [address]    ABGS No results for input(s): PHART, PO2ART, TCO2, HCO3 in the last 72 hours.  Invalid input(s): PCO2 CULTURES Recent Results (from the past 240 hour(s))  Culture, blood (Routine X 2) w Reflex to ID Panel     Status: None (Preliminary result)   Collection Time: 10/08/17  5:42 PM  Result Value Ref Range Status   Specimen Description RIGHT ANTECUBITAL  Final   Special Requests   Final    BOTTLES DRAWN AEROBIC AND ANAEROBIC Blood Culture adequate volume   Culture NO GROWTH < 24 HOURS  Final   Report Status PENDING  Incomplete  Culture, blood (Routine X 2) w Reflex to ID Panel     Status: None (Preliminary result)   Collection Time: 10/08/17  5:43 PM  Result Value Ref Range Status   Specimen Description BLOOD LEFT ARM  Final   Special  Requests   Final    BOTTLES DRAWN AEROBIC AND ANAEROBIC Blood Culture adequate volume   Culture NO GROWTH < 24 HOURS  Final   Report Status PENDING  Incomplete   Studies/Results: Dg Chest 1 View  Result Date: 10/08/2017 CLINICAL DATA:  Shortness of breath, generalized weakness EXAM: CHEST 1 VIEW COMPARISON:  04/12/2017 FINDINGS: Patchy airspace disease throughout the lungs bilaterally, left greater than right. Small loculated left lateral pleural effusion. Very low lung volumes. Heart is normal size. IMPRESSION: Patchy bilateral airspace opacities, left greater than right concerning for multifocal pneumonia. Small loculated left pleural effusion. Low lung volumes. Electronically Signed   By: Rolm Baptise M.D.   On: 10/08/2017 16:20    Medications:  Prior to Admission:  Medications Prior to Admission  Medication Sig Dispense Refill Last Dose  . acetaminophen (TYLENOL) 325 MG tablet Take 650 mg by  mouth every 4 (four) hours as needed for mild pain or moderate pain.    10/08/2017 at Unknown time  . apixaban (ELIQUIS) 5 MG TABS tablet Take 5 mg by mouth 2 (two) times daily.   Past Week at 2000  . furosemide (LASIX) 40 MG tablet Take 1 tablet (40 mg total) by mouth 2 (two) times daily. 60 tablet 5 Past Week at Unknown time  . OXYGEN Inhale 2 L into the lungs daily. Oxygen @@ 2 L /mn via every q shift    10/09/2017 at Unknown time  . potassium chloride SA (K-DUR,KLOR-CON) 20 MEQ tablet Take 20 mEq by mouth daily.   10/08/2017 at Unknown time   Scheduled:  Continuous: . azithromycin Stopped (10/09/17 2000)  . cefTRIAXone (ROCEPHIN)  IV Stopped (10/09/17 1829)   EZV:[id number]  Assesment: She is admitted with community-acquired pneumonia.  She has significant anemia.  She has been on Eliquis but that was discontinued so it should be taken off her home medication list.  I have discontinued it here in the hospital.  She has congestive heart failure and has been on Lasix but I do not think she  needs it right now.  She has a decubitus but mostly because she is very inactive and immobile. Principal Problem:   CAP (community acquired pneumonia) Active Problems:   Macular degeneration (senile) of retina   Congestive heart failure (CHF) ([facility])   COPD (chronic obstructive pulmonary disease) ([facility])   Personal history of pulmonary embolism   Dehydration   Decubitus ulcer of sacral region, stage 1   Pneumonia   Community acquired pneumonia    Plan: Continue antibiotics.  Workup anemia.  Her initial stool was negative for blood.  Physical therapy consultation    LOS: 1 day   Jason Hauge L 10/10/2017, 8:25 AM

## 2017-10-10 NOTE — Progress Notes (Signed)
Dr Heather Miller made aware that lab has been unable to obtain her blood for labs today.

## 2017-10-10 NOTE — Plan of Care (Signed)
  Progressing Acute Rehab PT Goals(only PT should resolve) Pt will Roll Supine to Side 10/10/2017 1428 - Progressing by Hartnett-Rands, Namrata Dangler, PT Flowsheets Taken 10/10/2017 1428  Pt will Roll Supine to Side with min assist;with mod assist 10/10/2017 1402 - Progressing by Hartnett-Rands, Britta MccreedyBarbara, PT Pt Will Go Supine/Side To Sit 10/10/2017 1428 - Progressing by Hartnett-Rands, Britta MccreedyBarbara, PT Flowsheets Taken 10/10/2017 1428  Pt will go Supine/Side to Sit with minimal assist;with moderate assist 10/10/2017 1402 - Progressing by Hartnett-Rands, Britta MccreedyBarbara, PT Pt Will Go Sit To Supine/Side 10/10/2017 1428 - Progressing by Epifanio LeschesHartnett-Rands, Clanton Emanuelson, PT Flowsheets Taken 10/10/2017 1428  Pt will go Sit to Supine/Side with minimal assist;with moderate assist 10/10/2017 1402 - Progressing by Epifanio LeschesHartnett-Rands, Ulani Degrasse, PT Pt/caregiver will Perform Home Exercise Program 10/10/2017 1428 - Progressing by Epifanio LeschesHartnett-Rands, Ruth Tully, PT Flowsheets Taken 10/10/2017 1428  Pt/caregiver will Perform Home Exercise Program For increased strengthening;With minimal assist 10/10/2017 1402 - Progressing by Epifanio LeschesHartnett-Rands, Sabrina Keough, PT Katina DungBarbara D. Hartnett-Rands, MS, PT [facility] [phone]

## 2017-10-10 NOTE — Evaluation (Signed)
Physical Therapy Evaluation Patient Details Name: Heather GarrisonSarah P Miller MRN: [id number] DOB: 10/25/1935 Today's Date: 10/10/2017   History of Present Illness  Heather GarrisonSarah P Miller is a 81 y.o. female with medical history significant for COPD on home oxygen, chronic diastolic heart failure, CKD III, history of PE on anticoagulation with Eliquis, debility, and macular degeneration who presented to the emergency department with worsening lightheadedness and dizziness along with shortness of breath.  She has also had a cough with yellowish sputum production over the last several days, but denies any fever or chills.  She has been taking her medications as prescribed and apparently is now nonambulatory and wears diapers at home according to the daughter at the bedside who lives next door to her, and takes care of her.  She has had a diminished appetite over the last 2-3 days with overall worsening weakness.  Of note, both the patient and daughter claimed that much of her primary care is done by Heather Miller and that they have not seen Heather Miller who is on record as her primary care physician in "quite some time."  They would like to see Heather Miller while here in the hospital.    Clinical Impression   Patient demonstrates diminished skin integrity, strength, functional mobility in and out of bed, range of motion, and self-care limited secondary to c/o shortness of breath and painful swollen bilateral lower extremities.  Despite assistance from family members in her home, she is alone for several hours each day and unable to attain position changes independently. Patient will benefit from continued physical therapy in hospital and recommended venue below to increase strength, balance, endurance, mobility for safe ADLs, position changes and ability for safe out of bed transfers to a bed side commode.        Follow Up Recommendations SNF    Equipment Recommendations  Hospital bed    Recommendations for Other Services        Precautions / Restrictions        Mobility  Bed Mobility Overal bed mobility: Needs Assistance Bed Mobility: Rolling;Sidelying to Sit;Supine to Sit Rolling: Mod assist Sidelying to sit: HOB elevated;Mod assist Supine to sit: HOB elevated;Mod assist        Transfers                 General transfer comment: Dependent  Ambulation/Gait Ambulation/Gait assistance: (Non-ambulatory)              Stairs            Wheelchair Mobility    Modified Rankin (Stroke Patients Only)       Balance                                             Pertinent Vitals/Pain Pain Assessment: 0-10 Pain Score: 3  Pain Location: in edematous bilateral lower extremities with 3+ pitting edema assessment Pain Descriptors / Indicators: Moaning    Home Living Family/patient expects to be discharged to:: Private residence Living Arrangements: Alone(with established daily assistance from 1 daughter and weekly assistance on weekends from another daughter.) Available Help at Discharge: Family Type of Home: House Home Access: Ramped entrance     Home Layout: One level Home Equipment: Wheelchair - manual      Prior Function Level of Independence: Needs assistance   Gait / Transfers Assistance Needed: Non-ambulatory; has not been out  of bed in months due to swelling and pain in lower extremities  ADL's / Homemaking Assistance Needed: total assist  Comments: 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     Hand Dominance        Extremity/Trunk Assessment   Upper Extremity Assessment Upper Extremity Assessment: Overall WFL for tasks assessed RUE Deficits / Details: over shoulder activity AROM limited    Lower Extremity Assessment Lower Extremity Assessment: Generalized weakness(BLEs AROM significantly dimished; AAROM for hip flexion, abduction, adduction, and internal/external rotation; 2/5 MMT for lower  extremities in general with the exception of ankle motion)       Communication   Communication: No difficulties  Cognition Arousal/Alertness: Awake/alert Behavior During Therapy: WFL for tasks assessed/performed Overall Cognitive Status: Within Functional Limits for tasks assessed                                        General Comments General comments (skin integrity, edema, etc.): decubitus ulcers on sacrum, right heel, left lateral malleolus - covered with clean dressings during evaluation; 3+ pitting edema bilateral lower extremities; patient non-ambulatory    Exercises General Exercises - Lower Extremity Short Arc QuadBarbaraann Miller: AAROM;Supine;Both;10 reps Hip Flexion/Marching: AAROM;Supine;Strengthening;Both;10 reps Heel Raises: AROM;Strengthening;Supine;Both;10 reps   Assessment/Plan    PT Assessment Patient needs continued PT services  PT Problem List Decreased strength;Decreased range of motion;Decreased activity tolerance;Decreased mobility;Pain;Decreased skin integrity       PT Treatment Interventions Functional mobility training;Patient/family education;Therapeutic activities;Therapeutic exercise    PT Goals (Current goals can be found in the Care Plan section)  Acute Rehab PT Goals Patient Stated Goal: return home PT Goal Formulation: With patient Time For Goal Achievement: 10/24/17 Potential to Achieve Goals: Fair    Frequency Min 3X/week   Barriers to discharge Decreased caregiver support Per patient report she is home alone 10 hours/day most days of the week and is unable to attain independent bed mobility for pressure relief    Co-evaluation               AM-PAC PT "6 Clicks" Daily Activity  Outcome Measure Difficulty turning over in bed (including adjusting bedclothes, sheets and blankets)?: Unable Difficulty moving from lying on back to sitting on the side of the bed? : Unable Difficulty sitting down on and standing up from a chair with  arms (e.g., wheelchair, bedside commode, etc,.)?: Unable Help needed moving to and from a bed to chair (including a wheelchair)?: Total Help needed walking in hospital room?: Total Help needed climbing 3-5 steps with a railing? : Total 6 Click Score: 6    End of Session Equipment Utilized During Treatment: Oxygen(patient on 1L O2 in room; patient reported 2L O2 in home) Activity Tolerance: Treatment limited secondary to medical complications (Comment) Patient left: in bed;with call bell/phone within reach Nurse Communication: Mobility status;Other (comment)(communicated with case management and nursing regarding patient may benefit from a specialty bed at home to assist in pressure relief) PT Visit Diagnosis: Muscle weakness (generalized) (M62.81);Other abnormalities of gait and mobility (R26.89);Unsteadiness on feet (R26.81)    Time: 1001-1027 PT Time Calculation (min) (ACUTE ONLY): 26 min   Charges:   PT Evaluation $PT Eval Low Complexity: 1 Low PT Treatments $Therapeutic Exercise: 8-22 mins   PT G Codes:   PT G-Codes **NOT FOR INPATIENT CLASS** Functional Assessment Tool Used: AM-PAC 6  Clicks Basic Mobility;Clinical judgement Functional Limitation: Changing and maintaining body position;Self care Changing and Maintaining Body Position Current Status (W0981): 100 percent impaired, limited or restricted Changing and Maintaining Body Position Goal Status (X9147): At least 80 percent but less than 100 percent impaired, limited or restricted Changing and Maintaining Body Position Discharge Status [phone]): At least 80 percent but less than 100 percent impaired, limited or restricted Self Care Current Status (O1308): 100 percent impaired, limited or restricted Self Care Goal Status (M5784): At least 80 percent but less than 100 percent impaired, limited or restricted Self Care Discharge Status [phone]): At least 80 percent but less than 100 percent impaired, limited or restricted     Katina Dung. Hartnett-Rands, MS, PT Per Diem PT [facility] [phone]

## 2017-10-11 MED ORDER — GERHARDT'S BUTT CREAM
TOPICAL_CREAM | Freq: Two times a day (BID) | CUTANEOUS | Status: DC
  Administered 2017-10-11 – 2017-10-12 (×3): via TOPICAL
  Filled 2017-10-11: qty 1

## 2017-10-11 MED ORDER — LEVOFLOXACIN 500 MG PO TABS
500.0000 mg | ORAL_TABLET | ORAL | Status: DC
  Administered 2017-10-11: 500 mg via ORAL
  Filled 2017-10-11: qty 1

## 2017-10-11 MED ORDER — POLYSACCHARIDE IRON COMPLEX 150 MG PO CAPS
150.0000 mg | ORAL_CAPSULE | Freq: Every day | ORAL | Status: DC
  Administered 2017-10-11 – 2017-10-12 (×2): 150 mg via ORAL
  Filled 2017-10-11 (×2): qty 1

## 2017-10-11 NOTE — Plan of Care (Signed)
Pt care maintained to wound surface areas-sacrum; buttocks and bilateral heels. Endorsement for WOCN to PCP-followup ongoing.AW

## 2017-10-11 NOTE — Consult Note (Addendum)
WOC Nurse wound consult note Reason for Consult: left lateral malleolus pressure injury and MASD IAD wounds on buttocks. Wound type: pressure, MASD IAD Pressure Injury POA: Yes Measurement: left lateral malleolus 1cm x 0.5cm x 0.1cm stage III pink wound bed, scant exudate, no odor noted. Butterfly shaped area at sacrum and bilateral proximal, medial buttocks measuring 10cm x 8cm with multiple full thickness wounds, beefy red wound beds, scant sanguinous drainage, no odor. Bilateral buttocks with multiple partial thickness areas from IAD. Wound bed: see above Drainage (amount, consistency, odor) see above Periwound: intact Dressing procedure/placement/frequency: I have provided nurses with orders for NS wet to dry dressings for left lateral malleolus. Will order Gerhardt's Butt Cream for butterfly shaped upper buttock area.  Will order Purple top Criticaid Barrier Cream for rest of buttocks and perineal areas and leave open to air. I will order Prevalon Boots which pt is to take with her upon to discharge to facility. Ordered mattress with low air loss feature unless pt is to leave today for SNF. Patient's nutritional level is poor, recommend either supplements or nutritional consult for optimal wound healing, please order if you agree. We will not follow, but will remain available to this patient, to nursing, and the medical and/or surgical teams.  Please re-consult if we need to assist further.   Barnett HatterMelinda Inga Noller, RN-C, WTA-C Wound Treatment Associate

## 2017-10-11 NOTE — Clinical Social Work Note (Signed)
LCSW attempted to assess patient, however she was asleep.  LCSW will re-attempt at a later time.    Charnise Lovan, Juleen ChinaHeather D, LCSW

## 2017-10-11 NOTE — Progress Notes (Signed)
PT Cancellation Note  Patient Details Name: Heather GarrisonSarah P Miller MRN: [id number] DOB: 12/17/1935   Cancelled Treatment:    Reason Eval/Treat Not Completed: Fatigue/lethargy limiting ability to participate Attempted PT session with pt.  Pt stated she is too fatigued to participate (with one eye open) and had rough night last night.  Pt explained benefits of completing session for functional strengthening and to reduce risk of weakness and other disorders.  Pt left in bed with family member present.  [address]Casey Cockerham, LPTA; CBIS [phone]  [name], Casey Jo 10/11/2017, 2:07 PM

## 2017-10-11 NOTE — Progress Notes (Signed)
Subjective: She was admitted with pneumonia.  She is doing okay.  It was recommended that she have a wound care consult and I think that is appropriate.  Physical therapy recommended that she go to skilled care facility and I think that is appropriate.  [facility] would [facility] in the evening if possible.  Objective: Vital signs in last 24 hours: Temp:  [97.7 F (36.5 C)-97.9 F (36.6 C)] 97.7 F (36.5 C) (12/19 0451) Pulse Rate:  [94-95] 94 (12/19 0451) Resp:  [16] 16 (12/19 0451) BP: (90-100)/(54-71) 90/54 (12/19 0451) SpO2:  [100 %] 100 % (12/19 0451) Weight change:  Last BM Date: 10/07/17  Intake/Output from previous day: 12/18 0701 - 12/19 0700 In: 732.5 [P.O.:360; I.V.:72.5; IV Piggyback:300] Out: -   PHYSICAL EXAM General appearance: alert, cooperative and no distress Resp: clear to auscultation bilaterally Cardio: regular rate and rhythm, S1, S2 normal, no murmur, click, rub or gallop GI: soft, non-tender; bowel sounds normal; no masses,  no organomegaly Extremities: extremities normal, atraumatic, no cyanosis or edema She has extensive decubiti on her sacrum and buttocks area.  Lab Results:  No results found for this or any previous visit (from the past 48 hour(s)).  ABGS No results for input(s): PHART, PO2ART, TCO2, HCO3 in the last 72 hours.  Invalid input(s): PCO2 CULTURES Recent Results (from the past 240 hour(s))  Culture, blood (Routine X 2) w Reflex to ID Panel     Status: None (Preliminary result)   Collection Time: 10/08/17  5:42 PM  Result Value Ref Range Status   Specimen Description RIGHT ANTECUBITAL  Final   Special Requests   Final    BOTTLES DRAWN AEROBIC AND ANAEROBIC Blood Culture adequate volume   Culture NO GROWTH 2 DAYS  Final   Report Status PENDING  Incomplete  Culture, blood (Routine X 2) w Reflex to ID Panel     Status: None (Preliminary result)   Collection Time: 10/08/17  5:43 PM  Result Value Ref Range Status   Specimen  Description BLOOD LEFT ARM  Final   Special Requests   Final    BOTTLES DRAWN AEROBIC AND ANAEROBIC Blood Culture adequate volume   Culture NO GROWTH 2 DAYS  Final   Report Status PENDING  Incomplete   Studies/Results: No results found.  Medications:  Prior to Admission:  Medications Prior to Admission  Medication Sig Dispense Refill Last Dose  . acetaminophen (TYLENOL) 325 MG tablet Take 650 mg by mouth every 4 (four) hours as needed for mild pain or moderate pain.    10/08/2017 at Unknown time  . apixaban (ELIQUIS) 5 MG TABS tablet Take 5 mg by mouth 2 (two) times daily.   Past Week at 2000  . furosemide (LASIX) 40 MG tablet Take 1 tablet (40 mg total) by mouth 2 (two) times daily. 60 tablet 5 Past Week at Unknown time  . OXYGEN Inhale 2 L into the lungs daily. Oxygen @@ 2 L /mn via every q shift    10/09/2017 at Unknown time  . potassium chloride SA (K-DUR,KLOR-CON) 20 MEQ tablet Take 20 mEq by mouth daily.   10/08/2017 at Unknown time   Scheduled:  Continuous: . sodium chloride 50 mL/hr at 10/10/17 1500  . azithromycin Stopped (10/10/17 1900)  . cefTRIAXone (ROCEPHIN)  IV Stopped (10/10/17 1842)   ZOX:WRUEAVWUJWJXBPRN:acetaminophen  Assesment: She has community-acquired pneumonia.  We have lost IV access despite multiple attempts.  She is better so I think she is okay to  switch to oral antibiotics at this point.  At baseline she has COPD and that is doing pretty well.  She was dehydrated on admission but she is eating and drinking better now she is blind from macular degeneration.  She has diastolic heart failure and has some swelling of her legs but it actually looks better than it usually does when she is in the office she has decubiti and needs wound care consultation.  I agree she probably should go to skilled care facility.  She has anemia and we have not been able to get blood to work that up.  I have discussed this with the patient and her daughter and at this point we will simply begin iron  rather than put her through PICC line multiple sticks etc. Principal Problem:   CAP (community acquired pneumonia) Active Problems:   Macular degeneration (senile) of retina   Congestive heart failure (CHF) (HCC)   COPD (chronic obstructive pulmonary disease) (HCC)   Personal history of pulmonary embolism   Dehydration   Decubitus ulcer of sacral region, stage 1   Pneumonia   Community acquired pneumonia    Plan: As above    LOS: 2 days   Salif Tay L 10/11/2017, 8:46 AM

## 2017-10-12 DIAGNOSIS — J189 Pneumonia, unspecified organism: Secondary | ICD-10-CM | POA: Diagnosis not present

## 2017-10-12 DIAGNOSIS — R278 Other lack of coordination: Secondary | ICD-10-CM | POA: Diagnosis not present

## 2017-10-12 DIAGNOSIS — R1312 Dysphagia, oropharyngeal phase: Secondary | ICD-10-CM | POA: Diagnosis not present

## 2017-10-12 DIAGNOSIS — R2681 Unsteadiness on feet: Secondary | ICD-10-CM | POA: Diagnosis not present

## 2017-10-12 DIAGNOSIS — M6281 Muscle weakness (generalized): Secondary | ICD-10-CM | POA: Diagnosis not present

## 2017-10-12 MED ORDER — POLYSACCHARIDE IRON COMPLEX 150 MG PO CAPS: 150.0000 mg | ORAL_CAPSULE | Freq: Every day | ORAL | Status: AC

## 2017-10-12 MED ORDER — LEVOFLOXACIN 500 MG PO TABS: 500.0000 mg | ORAL_TABLET | ORAL | Status: AC

## 2017-10-12 MED ORDER — GERHARDT'S BUTT CREAM: 1.0000 "application " | TOPICAL_CREAM | Freq: Two times a day (BID) | CUTANEOUS | 5 refills | Status: AC

## 2017-10-12 MED ORDER — BOOST / RESOURCE BREEZE PO LIQD CUSTOM
1.0000 | Freq: Three times a day (TID) | ORAL | Status: DC
  Administered 2017-10-12 (×2): 1 via ORAL

## 2017-10-12 MED ORDER — CRITIC-AID THICK MOIST BARRIER EX PSTE: PASTE | CUTANEOUS | Status: AC

## 2017-10-12 NOTE — Progress Notes (Deleted)
PT Cancellation Note  Patient Details Name: Heather GarrisonSarah P Miller MRN: [id number] DOB: 12/29/1935   Cancelled Treatment:      Reason Eval/Treat Not Completed: Fatigue/lethargy limiting ability to participate Attempted PT session with pt.  Pt stated she is too fatigued to participate (with one eye open) and had rough night last night.  Pt explained benefits of completing session for functional strengthening and to reduce risk of weakness and other disorders.  Pt left in bed with family member present.   Katina DungBarbara D. Hartnett-Rands, MS, PT Per Diem PT [facility] [phone]

## 2017-10-12 NOTE — Progress Notes (Signed)
Subjective: She says she feels okay.  No new complaints.  Her breathing is better.  She is not coughing very much.  Objective: Vital signs in last 24 hours: Temp:  [97.9 F (36.6 C)-98.2 F (36.8 C)] 98.2 F (36.8 C) (12/20 0422) Pulse Rate:  [103-118] 114 (12/20 0422) Resp:  [16-18] 18 (12/20 0422) BP: (90-100)/(52-57) 90/56 (12/20 0422) SpO2:  [100 %] 100 % (12/20 0422) Weight:  [85.3 kg (188 lb 0.8 oz)-106.2 kg (234 lb 2.1 oz)] 106.2 kg (234 lb 2.1 oz) (12/20 0422) Weight change:  Last BM Date: 10/07/17  Intake/Output from previous day: 12/19 0701 - 12/20 0700 In: 220 [P.O.:220] Out: -   PHYSICAL EXAM General appearance: alert, cooperative and no distress Resp: clear to auscultation bilaterally Cardio: regular rate and rhythm, S1, S2 normal, no murmur, click, rub or gallop GI: soft, non-tender; bowel sounds normal; no masses,  no organomegaly Extremities: She has 1+ edema bilateral She is blind  Lab Results:  No results found for this or any previous visit (from the past 48 hour(s)).  ABGS No results for input(s): PHART, PO2ART, TCO2, HCO3 in the last 72 hours.  Invalid input(s): PCO2 CULTURES Recent Results (from the past 240 hour(s))  Culture, blood (Routine X 2) w Reflex to ID Panel     Status: None (Preliminary result)   Collection Time: 10/08/17  5:42 PM  Result Value Ref Range Status   Specimen Description RIGHT ANTECUBITAL  Final   Special Requests   Final    BOTTLES DRAWN AEROBIC AND ANAEROBIC Blood Culture adequate volume   Culture NO GROWTH 4 DAYS  Final   Report Status PENDING  Incomplete  Culture, blood (Routine X 2) w Reflex to ID Panel     Status: None (Preliminary result)   Collection Time: 10/08/17  5:43 PM  Result Value Ref Range Status   Specimen Description BLOOD LEFT ARM  Final   Special Requests   Final    BOTTLES DRAWN AEROBIC AND ANAEROBIC Blood Culture adequate volume   Culture NO GROWTH 4 DAYS  Final   Report Status PENDING   Incomplete   Studies/Results: No results found.  Medications:  Prior to Admission:  Medications Prior to Admission  Medication Sig Dispense Refill Last Dose  . acetaminophen (TYLENOL) 325 MG tablet Take 650 mg by mouth every 4 (four) hours as needed for mild pain or moderate pain.    10/08/2017 at Unknown time  . apixaban (ELIQUIS) 5 MG TABS tablet Take 5 mg by mouth 2 (two) times daily.   Past Week at 2000  . furosemide (LASIX) 40 MG tablet Take 1 tablet (40 mg total) by mouth 2 (two) times daily. 60 tablet 5 Past Week at Unknown time  . OXYGEN Inhale 2 L into the lungs daily. Oxygen @@ 2 L /mn via every q shift    10/09/2017 at Unknown time  . potassium chloride SA (K-DUR,KLOR-CON) 20 MEQ tablet Take 20 mEq by mouth daily.   10/08/2017 at Unknown time   Scheduled: . Gerhardt's butt cream   Topical BID  . iron polysaccharides  150 mg Oral Daily  . levofloxacin  500 mg Oral Q48H   Continuous:  ZOX:WRUEAVWUJWJXBPRN:acetaminophen  Assesment: She was admitted with community-acquired pneumonia.  She has acute hypoxic respiratory failure.  She is anemic probably from chronic disease.  She has heart failure at baseline.  She is improving and plan will be for her to be discharged to skilled care facility.  If she has benefits  available Principal Problem:   CAP (community acquired pneumonia) Active Problems:   Macular degeneration (senile) of retina   Congestive heart failure (CHF) (HCC)   COPD (chronic obstructive pulmonary disease) (HCC)   Personal history of pulmonary embolism   Dehydration   Decubitus ulcer of sacral region, stage 1   Pneumonia   Community acquired pneumonia    Plan: As above    LOS: 3 days   Glendell Schlottman L 10/12/2017, 8:53 AM

## 2017-10-12 NOTE — Progress Notes (Signed)
[facility] is to be [facility] in [facility] condition. Patient and family made aware of discharge and is agreeable. [facility] LPN at facility. All questions and concerns answered. Patient awaiting EMS transport.  Quita SkyeMorgan P Dishmon, RN

## 2017-10-12 NOTE — Care Management (Signed)
DC plan has changed to SNF. CSW has made arrangements. CM spoke with pt's daughte, Heather Miller this AM and updated about DC plan. She is in agreement with DC plan.

## 2017-10-12 NOTE — NC FL2 (Signed)
[place] MEDICAID FL2 LEVEL OF CARE SCREENING TOOL     IDENTIFICATION  Patient Name: Heather GarrisonSarah P Miller Birthdate: 12/01/1935 Sex: female Admission Date (Current Location): 10/08/2017  [place]   Facility and Address:  [facility],  [address], [place] [id number]      Provider Number: [id number]  Attending Physician Name and Address:  Kari BaarsHawkins, Edward, MD  Relative Name and Phone Number:       Current Level of Care: Hospital Recommended Level of Care: Skilled Nursing Facility Prior Approval Number:    Date Approved/Denied:   PASRR Number: [id number]  Discharge Plan: SNF    Current Diagnoses: Patient Active Problem List   Diagnosis Date Noted  . Community acquired pneumonia 10/09/2017  . Dehydration 10/08/2017  . Decubitus ulcer of sacral region, stage 1 10/08/2017  . Pneumonia 10/08/2017  . Acute on chronic diastolic heart failure (HCC) 04/24/2017  . Personal history of pulmonary embolism 04/24/2017  . CHF (congestive heart failure) (HCC) 04/21/2017  . Influenza 11/28/2016  . COPD (chronic obstructive pulmonary disease) (HCC) 11/12/2016  . Acute respiratory failure with hypoxia (HCC) 11/11/2016  . CAP (community acquired pneumonia) 11/11/2016  . Congestive heart failure (CHF) (HCC) 11/11/2016  . Hypokalemia 11/11/2016  . Macular degeneration (senile) of retina 07/15/2009  . CHEST PAIN 07/15/2009    Orientation RESPIRATION BLADDER Height & Weight     Self, Time, Situation, Place  O2(2L) Continent Weight: 234 lb 2.1 oz (106.2 kg) Height:  5\' 4"  (162.6 cm)  BEHAVIORAL SYMPTOMS/MOOD NEUROLOGICAL BOWEL NUTRITION STATUS      Continent Diet(Heart healthy)  AMBULATORY STATUS COMMUNICATION OF NEEDS Skin   Extensive Assist Verbally PU Stage and Appropriate Care   PU Stage 2 Dressing: BID(Gauze to R heel, Gauze to L ankle)                   Personal Care Assistance Level of Assistance  Feeding, Dressing Bathing  Assistance: Limited assistance Feeding assistance: Limited assistance Dressing Assistance: Maximum assistance     Functional Limitations Info  Hearing, Speech, Sight Sight Info: Impaired Hearing Info: Adequate Speech Info: Adequate    SPECIAL CARE FACTORS FREQUENCY  PT (By licensed PT)     PT Frequency: 5 times week              Contractures Contractures Info: Not present    Additional Factors Info  Code Status Code Status Info: full             Current Medications (10/12/2017):  This is the current hospital active medication list Current Facility-Administered Medications  Medication Dose Route Frequency Provider Last Rate Last Dose  . acetaminophen (TYLENOL) tablet 650 mg  650 mg Oral Q4H PRN Maurilio LovelyShah, Pratik D, DO   650 mg at 10/09/17 0024  . feeding supplement (BOOST / RESOURCE BREEZE) liquid 1 Container  1 Container Oral TID BM Kari BaarsHawkins, Edward, MD   1 Container at 10/12/17 [phone]  . Gerhardt's butt cream   Topical BID Kari BaarsHawkins, Edward, MD      . iron polysaccharides (NIFEREX) capsule 150 mg  150 mg Oral Daily Kari BaarsHawkins, Edward, MD   150 mg at 10/12/17 0916  . levofloxacin (LEVAQUIN) tablet 500 mg  500 mg Oral Q48H Kari BaarsHawkins, Edward, MD   500 mg at 10/11/17 2327     Discharge Medications: Please see discharge summary for a list of discharge medications.  Relevant Imaging Results:  Relevant Lab Results:   Additional Information SSN: [id number]  [id number]  Heather GaultKathleen Shaniquia Brafford, LCSW

## 2017-10-12 NOTE — Clinical Social Work Placement (Signed)
   CLINICAL SOCIAL WORK PLACEMENT  NOTE  Date:  10/12/2017  Patient Details  Name: Heather Miller MRN: [id number] Date of Birth: 05/15/1936  Clinical Social Work is seeking post-discharge placement for this patient at the Skilled  Nursing Facility level of care (*CSW will initial, date and re-position this form in  chart as items are completed):  Yes   Patient/family provided with [facility] Clinical Social Work Department's list of facilities offering this level of care within the geographic area requested by the patient (or if unable, by the patient's family).  Yes   Patient/family informed of their freedom to choose among providers that offer the needed level of care, that participate in Medicare, Medicaid or managed care program needed by the patient, have an available bed and are willing to accept the patient.  Yes   Patient/family informed of [facility]'s ownership interest in [facility] and [facility], as well as of the fact that they are under no obligation to receive care at these facilities.  PASRR submitted to EDS on       PASRR number received on       Existing PASRR number confirmed on 10/12/17     FL2 transmitted to all facilities in geographic area requested by pt/family on 10/12/17     FL2 transmitted to all facilities within larger geographic area on       Patient informed that his/her managed care company has contracts with or will negotiate with certain facilities, including the following:        Yes   Patient/family informed of bed offers received.  [facility]     Physician recommends and patient chooses bed at      [facility] 10[facility].  Patient to be transferred to facility by [facility]     Patient family notified on 10/12/17 of transfer.  Name of family member notified:  March RummageSheila Broadnax (Dtr)     PHYSICIAN Please sign FL2     Additional Comment: Pt [facility]  [facility]. Referral made and pt accepted. Per RN, MD stated he would dc pt today. Will arrange transport and update RN. No other SW needs for dc.   _______________________________________________ Elliot GaultKathleen Cydnee Fuquay, LCSW 10/12/2017, 11:26 AM

## 2017-10-12 NOTE — Discharge Summary (Signed)
Physician Discharge Summary  Patient ID: Heather Miller MRN: [name]8 DOB/AGE: 79/11/1935 81 y.o. Primary Care Physician:Jaylanie Boschee, Ramon DredgeEdward, MD Admit date: 10/08/2017 Discharge date: 10/12/2017    Discharge Diagnoses:   Principal Problem:   CAP (community acquired pneumonia) Active Problems:   Macular degeneration (senile) of retina   Congestive heart failure (CHF) (HCC)   COPD (chronic obstructive pulmonary disease) (HCC)   Personal history of pulmonary embolism   Dehydration   Decubitus ulcer of sacral region, stage 1   Pneumonia   Community acquired pneumonia Severe malnutrition Anemia of chronic disease Allergies as of 10/12/2017   No Known Allergies     Medication List    STOP taking these medications   ELIQUIS 5 MG Tabs tablet Generic drug:  apixaban     TAKE these medications   acetaminophen 325 MG tablet Commonly known as:  TYLENOL Take 650 mg by mouth every 4 (four) hours as needed for mild pain or moderate pain.   CRITIC-AID THICK MOIST BARRIER Pste Apply to buttocks area twice daily   furosemide 40 MG tablet Commonly known as:  LASIX Take 1 tablet (40 mg total) by mouth 2 (two) times daily.   Gerhardt's butt cream Crea Apply 1 application topically 2 (two) times daily.   iron polysaccharides 150 MG capsule Commonly known as:  NIFEREX Take 1 capsule (150 mg total) by mouth daily.   levofloxacin 500 MG tablet Commonly known as:  LEVAQUIN Take 1 tablet (500 mg total) by mouth every other day. Start taking on:  10/13/2017   OXYGEN Inhale 2 L into the lungs daily. Oxygen @@ 2 L /mn via every q shift   potassium chloride SA 20 MEQ tablet Commonly known as:  K-DUR,KLOR-CON Take 20 mEq by mouth daily.       Discharged Condition: Improved    Consults: None  Significant Diagnostic Studies: Dg Chest 1 View  Result Date: 10/08/2017 CLINICAL DATA:  Shortness of breath, generalized weakness EXAM: CHEST 1 VIEW COMPARISON:  04/12/2017 FINDINGS:  Patchy airspace disease throughout the lungs bilaterally, left greater than right. Small loculated left lateral pleural effusion. Very low lung volumes. Heart is normal size. IMPRESSION: Patchy bilateral airspace opacities, left greater than right concerning for multifocal pneumonia. Small loculated left pleural effusion. Low lung volumes. Electronically Signed   By: Charlett NoseKevin  Dover M.D.   On: 10/08/2017 16:20    Lab Results: Basic Metabolic Panel: No results for input(s): NA, K, CL, CO2, GLUCOSE, BUN, CREATININE, CALCIUM, MG, PHOS in the last 72 hours. Liver Function Tests: No results for input(s): AST, ALT, ALKPHOS, BILITOT, PROT, ALBUMIN in the last 72 hours.   CBC: No results for input(s): WBC, NEUTROABS, HGB, HCT, MCV, PLT in the last 72 hours.  Recent Results (from the past 240 hour(s))  Culture, blood (Routine X 2) w Reflex to ID Panel     Status: None (Preliminary result)   Collection Time: 10/08/17  5:42 PM  Result Value Ref Range Status   Specimen Description RIGHT ANTECUBITAL  Final   Special Requests   Final    BOTTLES DRAWN AEROBIC AND ANAEROBIC Blood Culture adequate volume   Culture NO GROWTH 4 DAYS  Final   Report Status PENDING  Incomplete  Culture, blood (Routine X 2) w Reflex to ID Panel     Status: None (Preliminary result)   Collection Time: 10/08/17  5:43 PM  Result Value Ref Range Status   Specimen Description BLOOD LEFT ARM  Final   Special Requests  Final    BOTTLES DRAWN AEROBIC AND ANAEROBIC Blood Culture adequate volume   Culture NO GROWTH 4 DAYS  Final   Report Status PENDING  Incomplete     Hospital Course: This is a 81 year old who came to the hospital because of shortness of breath.  Found to have community-acquired pneumonia.  She was also anemic which appears to be from chronic disease.  She has malnutrition.  She had decubitus ulcers on her sacrum which were stage I.  She had poor IV access and initially she was able to be given some IV fluids and IV  antibiotics but IV access was lost.  She was switched to oral Levaquin and improved.  She was given iron.  She had wound care consultation with barrier creams applied as noted.  She is improved.  It was recommended that she go to skilled care facility and we are checking her benefits.  Discharge Exam: Blood pressure (!) 90/56, pulse (!) 114, temperature 98.2 F (36.8 C), temperature source Oral, resp. rate 18, height 5\' 4"  (1.626 m), weight 106.2 kg (234 lb 2.1 oz), SpO2 100 %. She is awake and alert.  She is blind.  She is overweight.  She has decubiti on her buttocks.  Disposition: discharge  Either to SNF or home with home health.      Signed: Sherica Paternostro L   10/12/2017, 9:03 AM

## 2017-10-12 NOTE — Care Management Important Message (Signed)
Important Message  Patient Details  Name: Heather GarrisonSarah P Miller MRN: [id number] Date of Birth: 08/23/1936   Medicare Important Message Given:  Yes    Malcolm Metrohildress, Dashauna Heymann Demske, RN 10/12/2017, 12:37 PM

## 2017-10-13 LAB — CULTURE, BLOOD (ROUTINE X 2)
CULTURE: NO GROWTH
CULTURE: NO GROWTH
SPECIAL REQUESTS: ADEQUATE
SPECIAL REQUESTS: ADEQUATE

## 2017-10-24 DIAGNOSIS — R278 Other lack of coordination: Secondary | ICD-10-CM | POA: Diagnosis not present

## 2017-10-24 DIAGNOSIS — R2681 Unsteadiness on feet: Secondary | ICD-10-CM | POA: Diagnosis not present

## 2017-10-24 DIAGNOSIS — M6281 Muscle weakness (generalized): Secondary | ICD-10-CM | POA: Diagnosis not present

## 2017-10-24 DIAGNOSIS — J189 Pneumonia, unspecified organism: Secondary | ICD-10-CM | POA: Diagnosis not present

## 2017-10-25 DIAGNOSIS — M6281 Muscle weakness (generalized): Secondary | ICD-10-CM | POA: Diagnosis not present

## 2017-10-25 DIAGNOSIS — R2681 Unsteadiness on feet: Secondary | ICD-10-CM | POA: Diagnosis not present

## 2017-10-25 DIAGNOSIS — R278 Other lack of coordination: Secondary | ICD-10-CM | POA: Diagnosis not present

## 2017-10-25 DIAGNOSIS — J189 Pneumonia, unspecified organism: Secondary | ICD-10-CM | POA: Diagnosis not present

## 2017-10-25 DIAGNOSIS — Z79899 Other long term (current) drug therapy: Secondary | ICD-10-CM | POA: Diagnosis not present

## 2017-10-26 DIAGNOSIS — M6281 Muscle weakness (generalized): Secondary | ICD-10-CM | POA: Diagnosis not present

## 2017-10-26 DIAGNOSIS — R2681 Unsteadiness on feet: Secondary | ICD-10-CM | POA: Diagnosis not present

## 2017-10-26 DIAGNOSIS — R278 Other lack of coordination: Secondary | ICD-10-CM | POA: Diagnosis not present

## 2017-10-26 DIAGNOSIS — J189 Pneumonia, unspecified organism: Secondary | ICD-10-CM | POA: Diagnosis not present

## 2017-10-27 DIAGNOSIS — R2681 Unsteadiness on feet: Secondary | ICD-10-CM | POA: Diagnosis not present

## 2017-10-27 DIAGNOSIS — J189 Pneumonia, unspecified organism: Secondary | ICD-10-CM | POA: Diagnosis not present

## 2017-10-27 DIAGNOSIS — D638 Anemia in other chronic diseases classified elsewhere: Secondary | ICD-10-CM | POA: Diagnosis not present

## 2017-10-27 DIAGNOSIS — J449 Chronic obstructive pulmonary disease, unspecified: Secondary | ICD-10-CM | POA: Diagnosis not present

## 2017-10-27 DIAGNOSIS — R278 Other lack of coordination: Secondary | ICD-10-CM | POA: Diagnosis not present

## 2017-10-27 DIAGNOSIS — I5032 Chronic diastolic (congestive) heart failure: Secondary | ICD-10-CM | POA: Diagnosis not present

## 2017-10-27 DIAGNOSIS — M6281 Muscle weakness (generalized): Secondary | ICD-10-CM | POA: Diagnosis not present

## 2017-10-30 DIAGNOSIS — D649 Anemia, unspecified: Secondary | ICD-10-CM | POA: Diagnosis not present

## 2017-10-30 DIAGNOSIS — R278 Other lack of coordination: Secondary | ICD-10-CM | POA: Diagnosis not present

## 2017-10-30 DIAGNOSIS — J189 Pneumonia, unspecified organism: Secondary | ICD-10-CM | POA: Diagnosis not present

## 2017-10-30 DIAGNOSIS — R2681 Unsteadiness on feet: Secondary | ICD-10-CM | POA: Diagnosis not present

## 2017-10-30 DIAGNOSIS — L89153 Pressure ulcer of sacral region, stage 3: Secondary | ICD-10-CM | POA: Diagnosis not present

## 2017-10-30 DIAGNOSIS — M6281 Muscle weakness (generalized): Secondary | ICD-10-CM | POA: Diagnosis not present

## 2017-10-31 DIAGNOSIS — M6281 Muscle weakness (generalized): Secondary | ICD-10-CM | POA: Diagnosis not present

## 2017-10-31 DIAGNOSIS — J189 Pneumonia, unspecified organism: Secondary | ICD-10-CM | POA: Diagnosis not present

## 2017-10-31 DIAGNOSIS — J449 Chronic obstructive pulmonary disease, unspecified: Secondary | ICD-10-CM | POA: Diagnosis not present

## 2017-10-31 DIAGNOSIS — R278 Other lack of coordination: Secondary | ICD-10-CM | POA: Diagnosis not present

## 2017-10-31 DIAGNOSIS — R2681 Unsteadiness on feet: Secondary | ICD-10-CM | POA: Diagnosis not present

## 2017-10-31 DIAGNOSIS — I5032 Chronic diastolic (congestive) heart failure: Secondary | ICD-10-CM | POA: Diagnosis not present

## 2017-11-01 DIAGNOSIS — R2681 Unsteadiness on feet: Secondary | ICD-10-CM | POA: Diagnosis not present

## 2017-11-01 DIAGNOSIS — M6281 Muscle weakness (generalized): Secondary | ICD-10-CM | POA: Diagnosis not present

## 2017-11-01 DIAGNOSIS — R278 Other lack of coordination: Secondary | ICD-10-CM | POA: Diagnosis not present

## 2017-11-01 DIAGNOSIS — J189 Pneumonia, unspecified organism: Secondary | ICD-10-CM | POA: Diagnosis not present

## 2017-11-02 DIAGNOSIS — R278 Other lack of coordination: Secondary | ICD-10-CM | POA: Diagnosis not present

## 2017-11-02 DIAGNOSIS — J449 Chronic obstructive pulmonary disease, unspecified: Secondary | ICD-10-CM | POA: Diagnosis not present

## 2017-11-02 DIAGNOSIS — M6281 Muscle weakness (generalized): Secondary | ICD-10-CM | POA: Diagnosis not present

## 2017-11-02 DIAGNOSIS — R2681 Unsteadiness on feet: Secondary | ICD-10-CM | POA: Diagnosis not present

## 2017-11-02 DIAGNOSIS — J189 Pneumonia, unspecified organism: Secondary | ICD-10-CM | POA: Diagnosis not present

## 2017-11-03 DIAGNOSIS — J189 Pneumonia, unspecified organism: Secondary | ICD-10-CM | POA: Diagnosis not present

## 2017-11-03 DIAGNOSIS — M6281 Muscle weakness (generalized): Secondary | ICD-10-CM | POA: Diagnosis not present

## 2017-11-03 DIAGNOSIS — R2681 Unsteadiness on feet: Secondary | ICD-10-CM | POA: Diagnosis not present

## 2017-11-03 DIAGNOSIS — R278 Other lack of coordination: Secondary | ICD-10-CM | POA: Diagnosis not present

## 2017-11-06 DIAGNOSIS — R278 Other lack of coordination: Secondary | ICD-10-CM | POA: Diagnosis not present

## 2017-11-06 DIAGNOSIS — R2681 Unsteadiness on feet: Secondary | ICD-10-CM | POA: Diagnosis not present

## 2017-11-06 DIAGNOSIS — M6281 Muscle weakness (generalized): Secondary | ICD-10-CM | POA: Diagnosis not present

## 2017-11-06 DIAGNOSIS — J189 Pneumonia, unspecified organism: Secondary | ICD-10-CM | POA: Diagnosis not present

## 2017-11-07 DIAGNOSIS — J189 Pneumonia, unspecified organism: Secondary | ICD-10-CM | POA: Diagnosis not present

## 2017-11-07 DIAGNOSIS — R2681 Unsteadiness on feet: Secondary | ICD-10-CM | POA: Diagnosis not present

## 2017-11-07 DIAGNOSIS — R278 Other lack of coordination: Secondary | ICD-10-CM | POA: Diagnosis not present

## 2017-11-07 DIAGNOSIS — M6281 Muscle weakness (generalized): Secondary | ICD-10-CM | POA: Diagnosis not present

## 2017-11-08 DIAGNOSIS — J189 Pneumonia, unspecified organism: Secondary | ICD-10-CM | POA: Diagnosis not present

## 2017-11-08 DIAGNOSIS — M6281 Muscle weakness (generalized): Secondary | ICD-10-CM | POA: Diagnosis not present

## 2017-11-08 DIAGNOSIS — I959 Hypotension, unspecified: Secondary | ICD-10-CM | POA: Diagnosis not present

## 2017-11-08 DIAGNOSIS — R278 Other lack of coordination: Secondary | ICD-10-CM | POA: Diagnosis not present

## 2017-11-08 DIAGNOSIS — R000 Tachycardia, unspecified: Secondary | ICD-10-CM | POA: Diagnosis not present

## 2017-11-08 DIAGNOSIS — D649 Anemia, unspecified: Secondary | ICD-10-CM | POA: Diagnosis not present

## 2017-11-08 DIAGNOSIS — R2681 Unsteadiness on feet: Secondary | ICD-10-CM | POA: Diagnosis not present

## 2017-11-09 DIAGNOSIS — R2681 Unsteadiness on feet: Secondary | ICD-10-CM | POA: Diagnosis not present

## 2017-11-09 DIAGNOSIS — R278 Other lack of coordination: Secondary | ICD-10-CM | POA: Diagnosis not present

## 2017-11-09 DIAGNOSIS — I959 Hypotension, unspecified: Secondary | ICD-10-CM | POA: Diagnosis not present

## 2017-11-09 DIAGNOSIS — J189 Pneumonia, unspecified organism: Secondary | ICD-10-CM | POA: Diagnosis not present

## 2017-11-09 DIAGNOSIS — L89153 Pressure ulcer of sacral region, stage 3: Secondary | ICD-10-CM | POA: Diagnosis not present

## 2017-11-09 DIAGNOSIS — M6281 Muscle weakness (generalized): Secondary | ICD-10-CM | POA: Diagnosis not present

## 2017-11-10 DIAGNOSIS — J189 Pneumonia, unspecified organism: Secondary | ICD-10-CM | POA: Diagnosis not present

## 2017-11-10 DIAGNOSIS — M6281 Muscle weakness (generalized): Secondary | ICD-10-CM | POA: Diagnosis not present

## 2017-11-10 DIAGNOSIS — R2681 Unsteadiness on feet: Secondary | ICD-10-CM | POA: Diagnosis not present

## 2017-11-10 DIAGNOSIS — R278 Other lack of coordination: Secondary | ICD-10-CM | POA: Diagnosis not present

## 2017-11-20 DIAGNOSIS — Z658 Other specified problems related to psychosocial circumstances: Secondary | ICD-10-CM | POA: Diagnosis not present

## 2017-11-27 DIAGNOSIS — H547 Unspecified visual loss: Secondary | ICD-10-CM | POA: Diagnosis not present

## 2017-11-27 DIAGNOSIS — R29898 Other symptoms and signs involving the musculoskeletal system: Secondary | ICD-10-CM | POA: Diagnosis not present

## 2017-11-27 DIAGNOSIS — L89152 Pressure ulcer of sacral region, stage 2: Secondary | ICD-10-CM | POA: Diagnosis not present

## 2017-11-27 DIAGNOSIS — L89151 Pressure ulcer of sacral region, stage 1: Secondary | ICD-10-CM | POA: Diagnosis not present

## 2017-12-03 DIAGNOSIS — J449 Chronic obstructive pulmonary disease, unspecified: Secondary | ICD-10-CM | POA: Diagnosis not present

## 2017-12-04 DIAGNOSIS — D638 Anemia in other chronic diseases classified elsewhere: Secondary | ICD-10-CM | POA: Diagnosis not present

## 2017-12-04 DIAGNOSIS — I5032 Chronic diastolic (congestive) heart failure: Secondary | ICD-10-CM | POA: Diagnosis not present

## 2017-12-04 DIAGNOSIS — N183 Chronic kidney disease, stage 3 (moderate): Secondary | ICD-10-CM | POA: Diagnosis not present

## 2017-12-04 DIAGNOSIS — J449 Chronic obstructive pulmonary disease, unspecified: Secondary | ICD-10-CM | POA: Diagnosis not present

## 2017-12-11 DIAGNOSIS — R05 Cough: Secondary | ICD-10-CM | POA: Diagnosis not present

## 2017-12-11 DIAGNOSIS — R0789 Other chest pain: Secondary | ICD-10-CM | POA: Diagnosis not present

## 2017-12-15 DIAGNOSIS — L84 Corns and callosities: Secondary | ICD-10-CM | POA: Diagnosis not present

## 2017-12-15 DIAGNOSIS — M79675 Pain in left toe(s): Secondary | ICD-10-CM | POA: Diagnosis not present

## 2017-12-15 DIAGNOSIS — I739 Peripheral vascular disease, unspecified: Secondary | ICD-10-CM | POA: Diagnosis not present

## 2017-12-15 DIAGNOSIS — B351 Tinea unguium: Secondary | ICD-10-CM | POA: Diagnosis not present

## 2017-12-22 DIAGNOSIS — M6281 Muscle weakness (generalized): Secondary | ICD-10-CM | POA: Diagnosis not present

## 2017-12-22 DIAGNOSIS — J189 Pneumonia, unspecified organism: Secondary | ICD-10-CM | POA: Diagnosis not present

## 2017-12-22 DIAGNOSIS — R278 Other lack of coordination: Secondary | ICD-10-CM | POA: Diagnosis not present

## 2017-12-31 DIAGNOSIS — J449 Chronic obstructive pulmonary disease, unspecified: Secondary | ICD-10-CM | POA: Diagnosis not present

## 2018-01-01 DIAGNOSIS — N183 Chronic kidney disease, stage 3 (moderate): Secondary | ICD-10-CM | POA: Diagnosis not present

## 2018-01-01 DIAGNOSIS — I5032 Chronic diastolic (congestive) heart failure: Secondary | ICD-10-CM | POA: Diagnosis not present

## 2018-01-01 DIAGNOSIS — D638 Anemia in other chronic diseases classified elsewhere: Secondary | ICD-10-CM | POA: Diagnosis not present

## 2018-01-01 DIAGNOSIS — J449 Chronic obstructive pulmonary disease, unspecified: Secondary | ICD-10-CM | POA: Diagnosis not present

## 2018-01-08 DIAGNOSIS — Z658 Other specified problems related to psychosocial circumstances: Secondary | ICD-10-CM | POA: Diagnosis not present

## 2018-01-29 DIAGNOSIS — I5032 Chronic diastolic (congestive) heart failure: Secondary | ICD-10-CM | POA: Diagnosis not present

## 2018-01-29 DIAGNOSIS — J449 Chronic obstructive pulmonary disease, unspecified: Secondary | ICD-10-CM | POA: Diagnosis not present

## 2018-01-29 DIAGNOSIS — D638 Anemia in other chronic diseases classified elsewhere: Secondary | ICD-10-CM | POA: Diagnosis not present

## 2018-01-29 DIAGNOSIS — N183 Chronic kidney disease, stage 3 (moderate): Secondary | ICD-10-CM | POA: Diagnosis not present

## 2018-01-31 DIAGNOSIS — J449 Chronic obstructive pulmonary disease, unspecified: Secondary | ICD-10-CM | POA: Diagnosis not present

## 2018-02-05 DIAGNOSIS — M940 Chondrocostal junction syndrome [Tietze]: Secondary | ICD-10-CM | POA: Diagnosis not present

## 2018-02-14 DIAGNOSIS — R634 Abnormal weight loss: Secondary | ICD-10-CM | POA: Diagnosis not present

## 2018-02-17 DIAGNOSIS — E039 Hypothyroidism, unspecified: Secondary | ICD-10-CM | POA: Diagnosis not present

## 2018-02-17 DIAGNOSIS — I10 Essential (primary) hypertension: Secondary | ICD-10-CM | POA: Diagnosis not present

## 2018-02-17 DIAGNOSIS — D649 Anemia, unspecified: Secondary | ICD-10-CM | POA: Diagnosis not present

## 2018-02-24 IMAGING — CR DG CHEST 1V PORT
1 series · 1 of 1 positions shown · non-contrast
Comparison: December 17, 2011

CLINICAL DATA: Shortness of breath with cough; left-sided chest
pain

EXAM:
PORTABLE CHEST 1 VIEW

[portable]
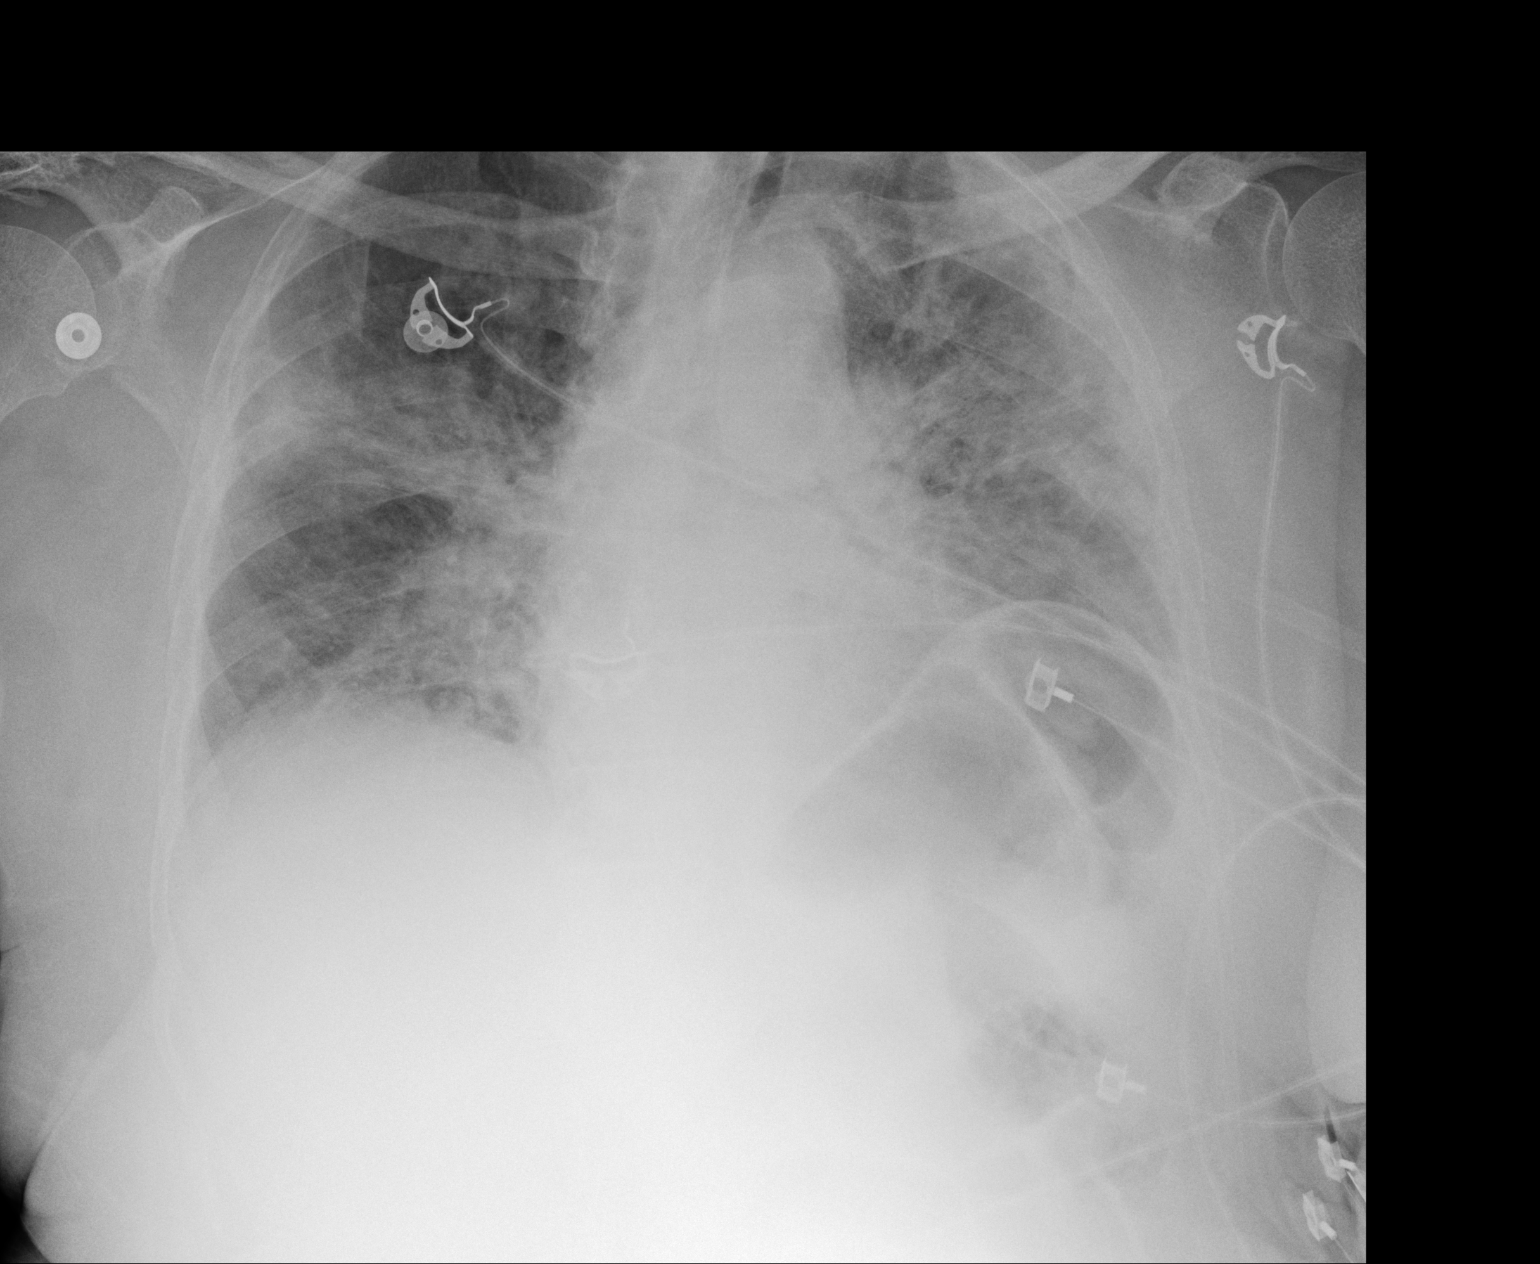

[1 of 1 positions shown; findings below may reference images not displayed]

FINDINGS: There is diffuse interstitial edema with patchy consolidation in the
right upper lobe. Heart is upper normal in size with pulmonary
venous hypertension. No evident adenopathy. No bone lesions. Slight
elevation of the left hemidiaphragm is stable. No pneumothorax.
IMPRESSION: Findings indicative of congestive heart failure. Question alveolar
edema versus superimposed pneumonia right upper lobe. Stable cardiac
silhouette. There is pulmonary venous hypertension.

## 2018-02-26 DIAGNOSIS — N183 Chronic kidney disease, stage 3 (moderate): Secondary | ICD-10-CM | POA: Diagnosis not present

## 2018-02-26 DIAGNOSIS — D649 Anemia, unspecified: Secondary | ICD-10-CM | POA: Diagnosis not present

## 2018-02-26 DIAGNOSIS — J449 Chronic obstructive pulmonary disease, unspecified: Secondary | ICD-10-CM | POA: Diagnosis not present

## 2018-02-26 DIAGNOSIS — I5032 Chronic diastolic (congestive) heart failure: Secondary | ICD-10-CM | POA: Diagnosis not present

## 2018-03-02 DIAGNOSIS — J449 Chronic obstructive pulmonary disease, unspecified: Secondary | ICD-10-CM | POA: Diagnosis not present

## 2018-03-02 IMAGING — CR DG CHEST 1V PORT
1 series · 1 of 1 positions shown · non-contrast
Comparison: 11/11/2016.

CLINICAL DATA: Respiratory failure.

EXAM:
PORTABLE CHEST 1 VIEW

[portable]
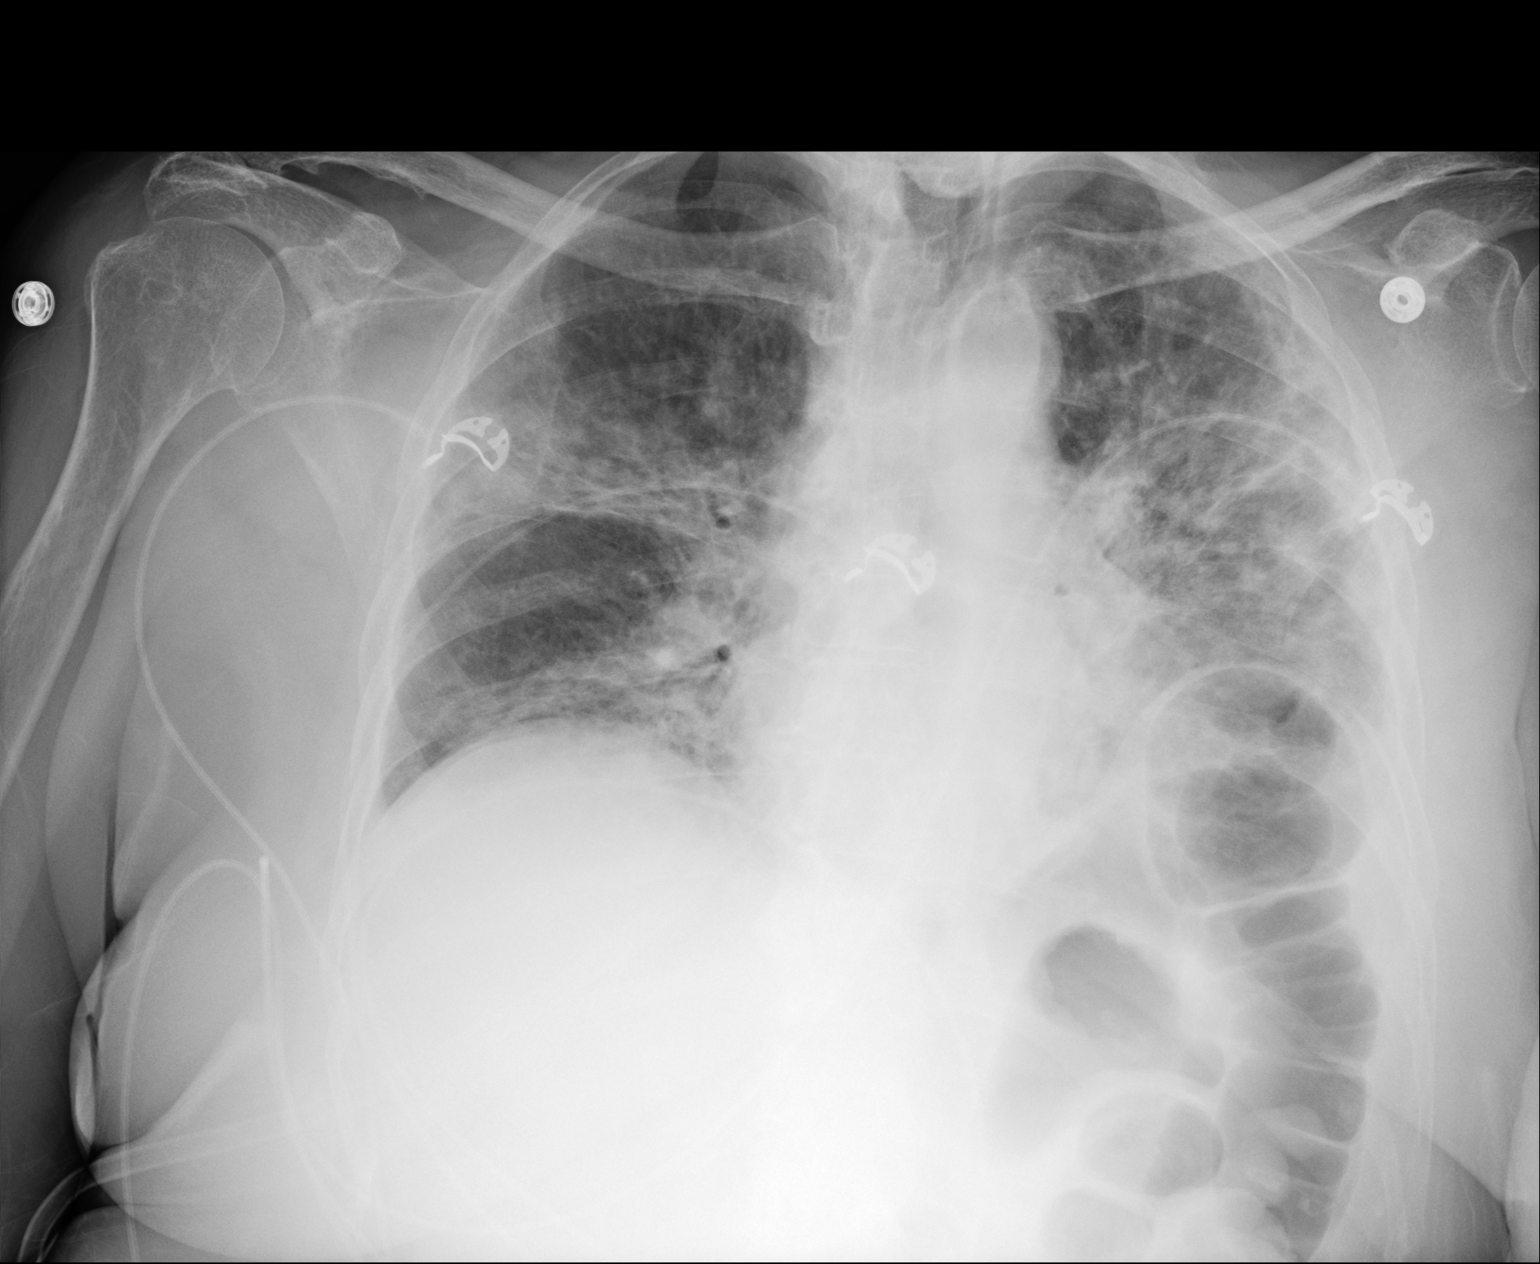

[1 of 1 positions shown; findings below may reference images not displayed]

FINDINGS: Mediastinum is stable. Heart size stable. Persistent unchanged
bilateral pulmonary infiltrates and/or edema. Low lung volumes. No
prominent pleural effusion. No pneumothorax . Stable elevation left
hemidiaphragm. Interposition of the colon left hemidiaphragm again
noted.
IMPRESSION: Persistent prominent bilateral pulmonary infiltrates and or edema.
No significant change.

## 2018-03-05 IMAGING — CR DG CHEST 1V PORT
1 series · 1 of 1 positions shown · non-contrast
Comparison: 11/17/2016 and prior exams

CLINICAL DATA: Shortness of breath for 3 weeks.

EXAM:
PORTABLE CHEST 1 VIEW

[portable]
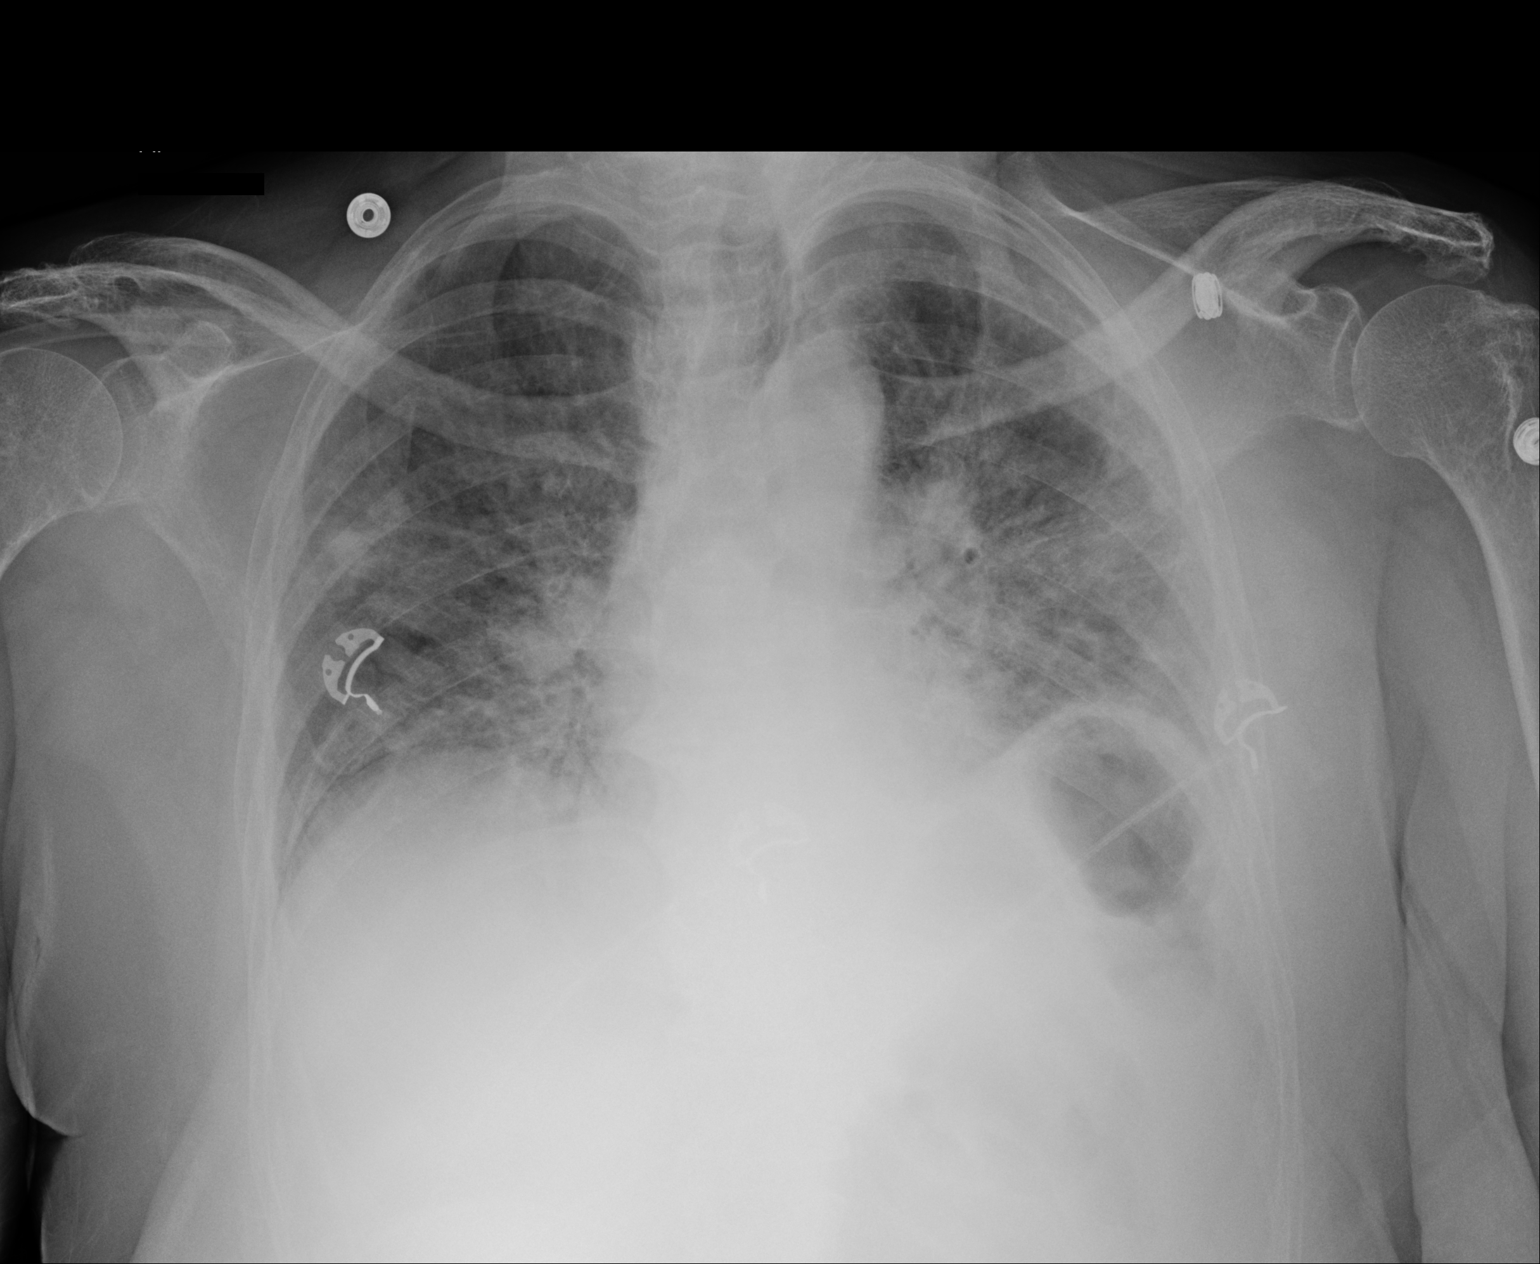

[1 of 1 positions shown; findings below may reference images not displayed]

FINDINGS: Diffuse bilateral airspace opacities are stable to slightly
increased from the prior study.

Cardiomediastinal silhouette is unchanged.

There is no evidence of pneumothorax or definite pleural effusion.

No acute bony abnormalities are noted.
IMPRESSION: Stable to slight increase in diffuse bilateral airspace disease.

## 2018-03-06 IMAGING — CT CT CHEST W/O CM
2 of 3 series · 15 of 36 positions shown, 18 images · non-contrast
Comparison: 11/20/2016 and 12/17/2011 chest x-ray. No comparison
chest CT.

CLINICAL DATA: 80-year-old female with shortness of breath for 3
weeks. Pneumonia. Subsequent encounter.

EXAM:
CT CHEST WITHOUT CONTRAST
TECHNIQUE: Multidetector CT imaging of the chest was performed following the
standard protocol without IV contrast.

[Series 2: thorax · axial · 0.79mm/px · z∈[+1121,+1403]mm · 12 of 167 slices shown, 15 images]
[im 13/167  mediastinal]
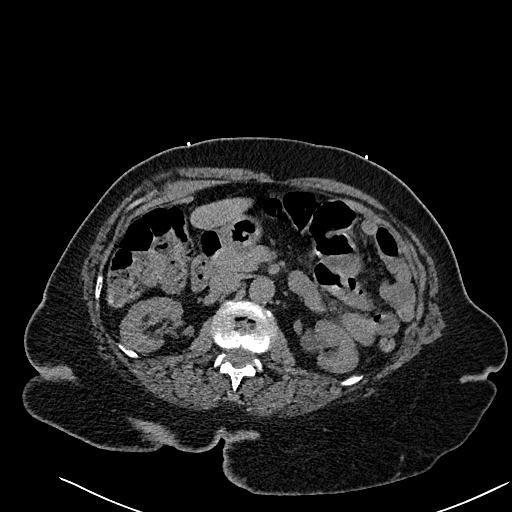
[im 13/167  lung]
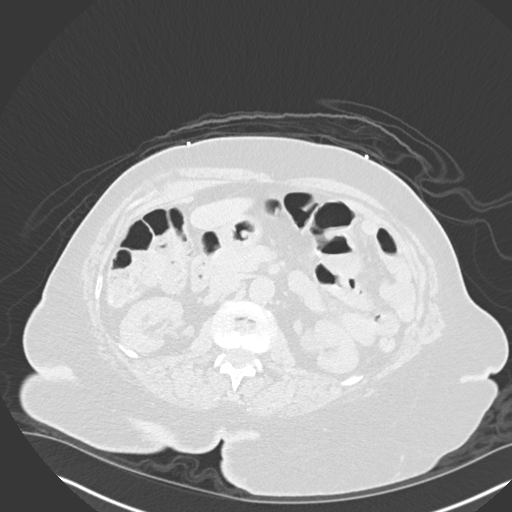
[im 25/167  lung]
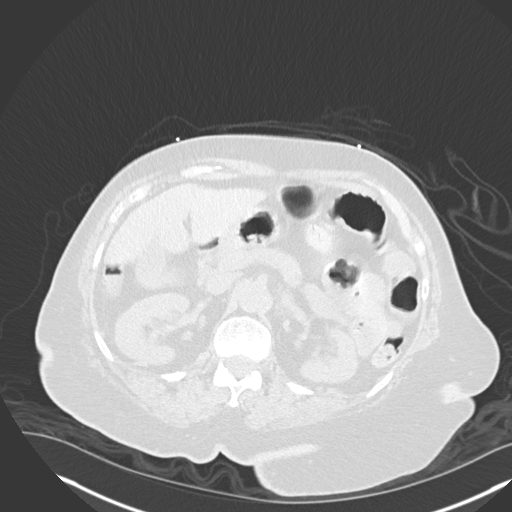
[im 37/167  lung]
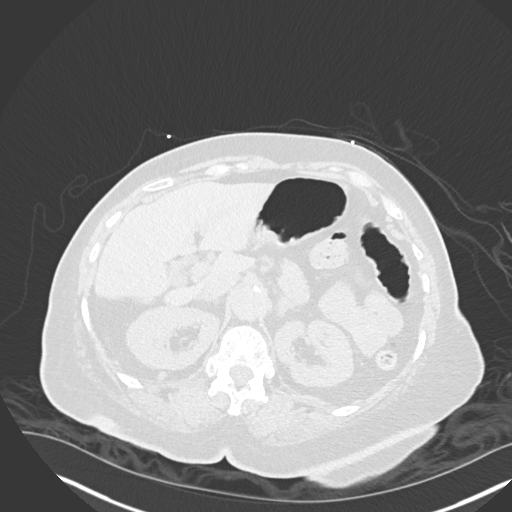
[im 50/167  lung]
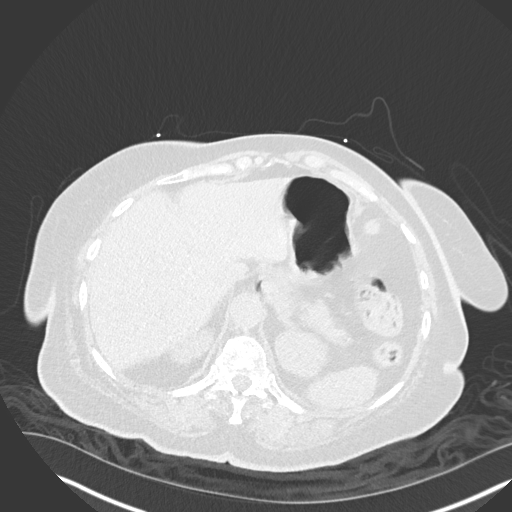
[im 62/167  mediastinal]
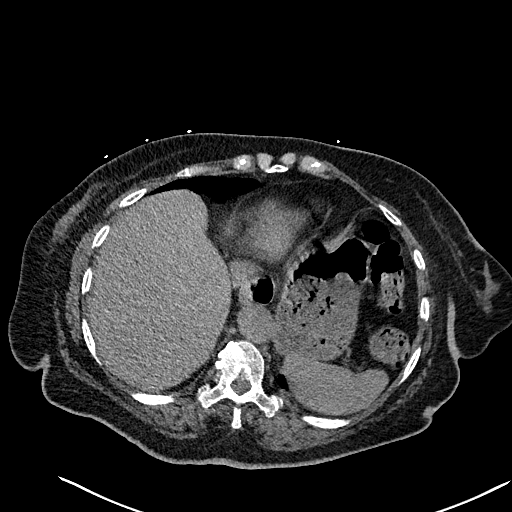
[im 62/167  lung]
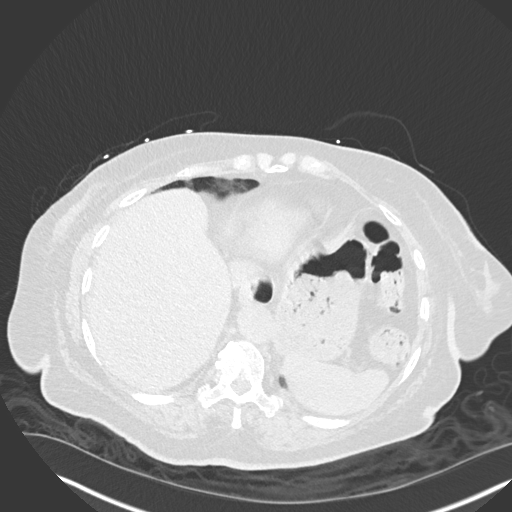
[im 74/167  lung]
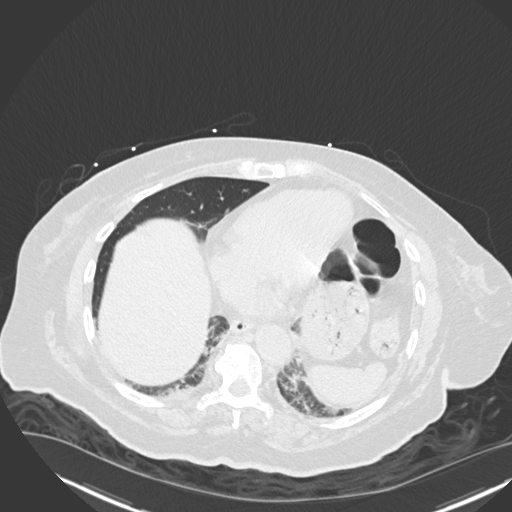
[im 93/167  lung]
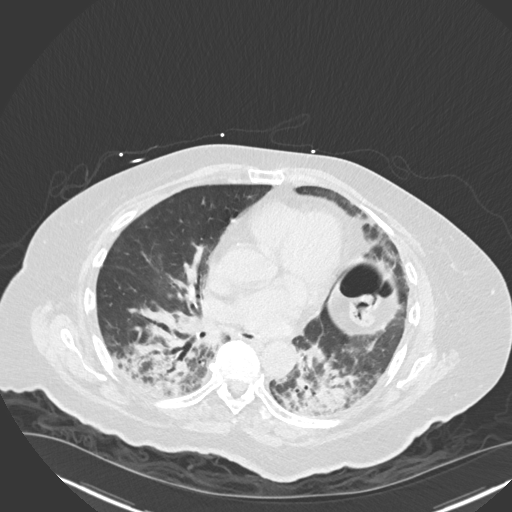
[im 105/167  lung]
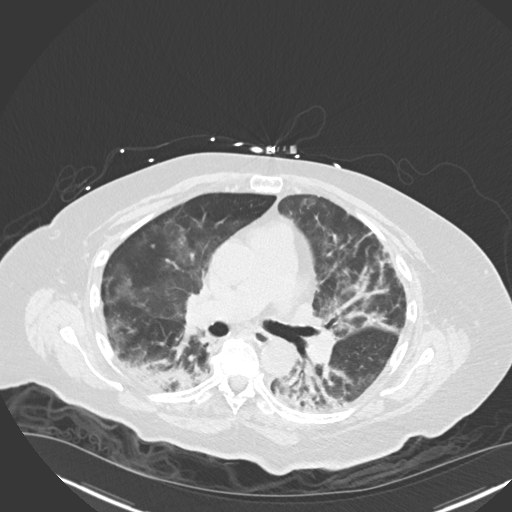
[im 117/167  mediastinal]
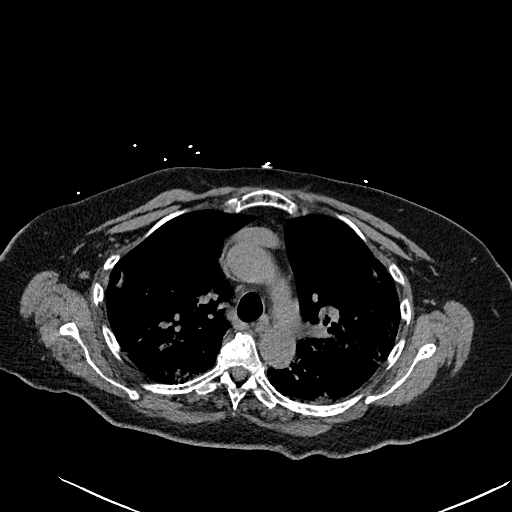
[im 117/167  lung]
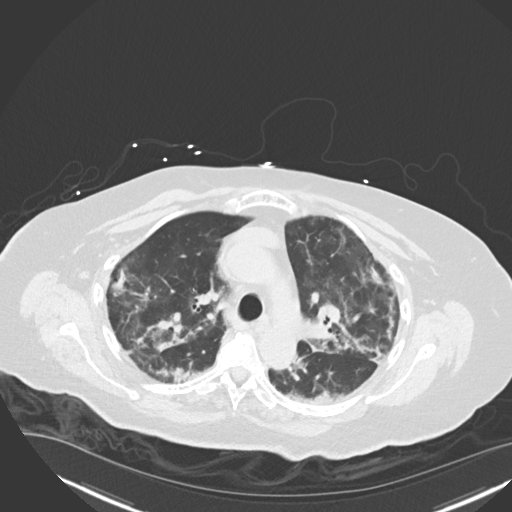
[im 130/167  lung]
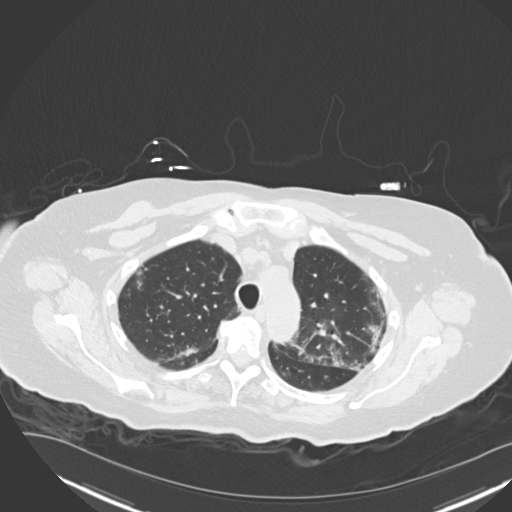
[im 142/167  lung]
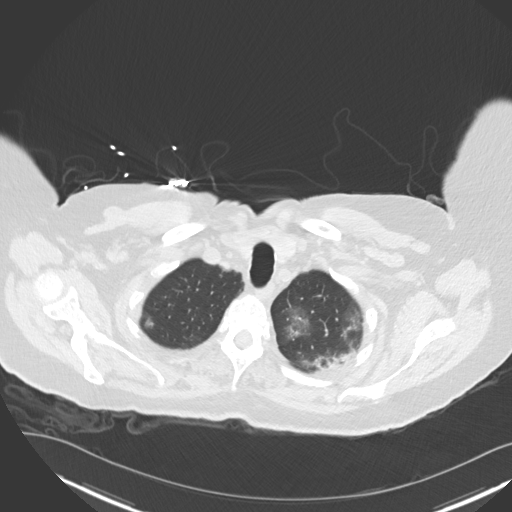
[im 154/167  lung]
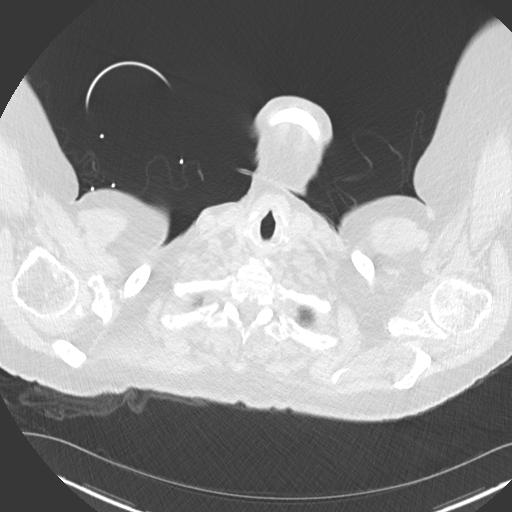

[Series 5: coronal · coronal · 0.65mm/px · 3 of 129 slices shown]
[im 26/129  lung]
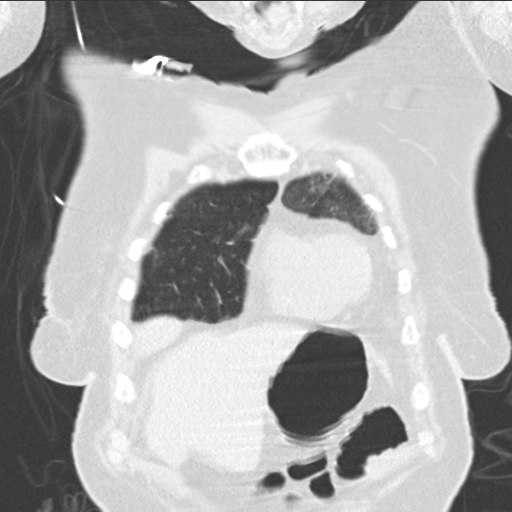
[im 52/129  lung]
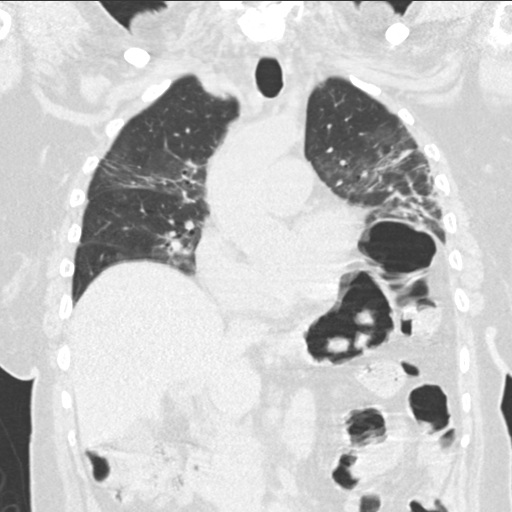
[im 77/129  lung]
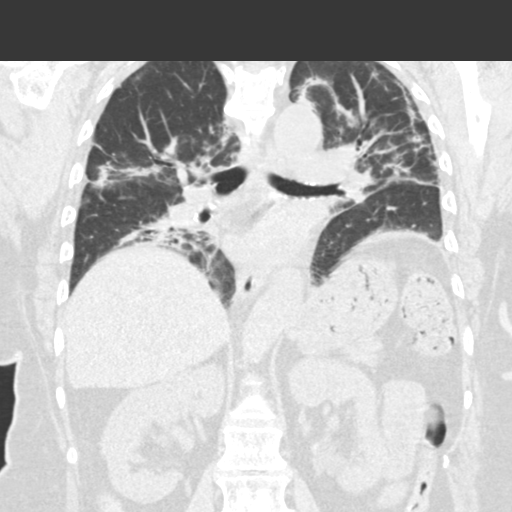

[15 of 36 positions shown; findings below may reference images not displayed]

FINDINGS: Cardiovascular: Heart minimally deformed by a elevated left
hemidiaphragm.

No aneurysmal dilation of the thoracic aorta. Scattered
atherosclerotic changes.

Slight prominence pulmonary arteries.

Mediastinum/Nodes: Normal size lymph nodes.

Small hiatal hernia.

Lungs/Pleura: Bilateral parenchymal changes greatest in the lung
bases and left upper lobe. Findings suspicious for infectious
infiltrates superimposed upon chronic changes/ bronchiectasis.
Trachea and mainstem bronchi are patent.

Mild pulmonary vascular congestion.

Elevated left hemidiaphragm.

Upper Abdomen: Mild hyperplasia adrenal glands. Scarring inferior
aspect left kidney.

Musculoskeletal: No destructive lesion or compression fracture.
IMPRESSION: Bilateral parenchymal changes greatest in the lung bases and left
upper lobe. Findings suspicious for infectious infiltrates
superimposed upon chronic changes/ bronchiectasis.

Mild pulmonary vascular congestion.

Elevated left hemidiaphragm.

## 2018-03-13 DIAGNOSIS — R0789 Other chest pain: Secondary | ICD-10-CM | POA: Diagnosis not present

## 2018-03-14 DIAGNOSIS — E039 Hypothyroidism, unspecified: Secondary | ICD-10-CM | POA: Diagnosis not present

## 2018-03-14 DIAGNOSIS — R946 Abnormal results of thyroid function studies: Secondary | ICD-10-CM | POA: Diagnosis not present

## 2018-03-26 DIAGNOSIS — I5032 Chronic diastolic (congestive) heart failure: Secondary | ICD-10-CM | POA: Diagnosis not present

## 2018-03-26 DIAGNOSIS — J449 Chronic obstructive pulmonary disease, unspecified: Secondary | ICD-10-CM | POA: Diagnosis not present

## 2018-03-26 DIAGNOSIS — N183 Chronic kidney disease, stage 3 (moderate): Secondary | ICD-10-CM | POA: Diagnosis not present

## 2018-03-26 DIAGNOSIS — D638 Anemia in other chronic diseases classified elsewhere: Secondary | ICD-10-CM | POA: Diagnosis not present

## 2018-03-27 DIAGNOSIS — I5032 Chronic diastolic (congestive) heart failure: Secondary | ICD-10-CM | POA: Diagnosis not present

## 2018-03-27 DIAGNOSIS — A419 Sepsis, unspecified organism: Secondary | ICD-10-CM | POA: Diagnosis not present

## 2018-03-27 DIAGNOSIS — I10 Essential (primary) hypertension: Secondary | ICD-10-CM | POA: Diagnosis not present

## 2018-03-27 DIAGNOSIS — R188 Other ascites: Secondary | ICD-10-CM | POA: Diagnosis not present

## 2018-03-27 DIAGNOSIS — R000 Tachycardia, unspecified: Secondary | ICD-10-CM | POA: Diagnosis not present

## 2018-03-27 DIAGNOSIS — B957 Other staphylococcus as the cause of diseases classified elsewhere: Secondary | ICD-10-CM | POA: Diagnosis not present

## 2018-03-27 DIAGNOSIS — J449 Chronic obstructive pulmonary disease, unspecified: Secondary | ICD-10-CM | POA: Diagnosis not present

## 2018-03-27 DIAGNOSIS — N183 Chronic kidney disease, stage 3 (moderate): Secondary | ICD-10-CM | POA: Diagnosis not present

## 2018-03-27 DIAGNOSIS — R079 Chest pain, unspecified: Secondary | ICD-10-CM | POA: Diagnosis not present

## 2018-03-27 DIAGNOSIS — M1990 Unspecified osteoarthritis, unspecified site: Secondary | ICD-10-CM | POA: Diagnosis not present

## 2018-03-27 DIAGNOSIS — D72829 Elevated white blood cell count, unspecified: Secondary | ICD-10-CM | POA: Diagnosis not present

## 2018-03-27 DIAGNOSIS — H548 Legal blindness, as defined in USA: Secondary | ICD-10-CM | POA: Diagnosis not present

## 2018-03-27 DIAGNOSIS — N289 Disorder of kidney and ureter, unspecified: Secondary | ICD-10-CM | POA: Diagnosis not present

## 2018-03-27 DIAGNOSIS — I959 Hypotension, unspecified: Secondary | ICD-10-CM | POA: Diagnosis not present

## 2018-03-27 DIAGNOSIS — J9809 Other diseases of bronchus, not elsewhere classified: Secondary | ICD-10-CM | POA: Diagnosis not present

## 2018-03-27 DIAGNOSIS — R6521 Severe sepsis with septic shock: Secondary | ICD-10-CM | POA: Diagnosis not present

## 2018-03-27 DIAGNOSIS — R279 Unspecified lack of coordination: Secondary | ICD-10-CM | POA: Diagnosis not present

## 2018-03-27 DIAGNOSIS — R5381 Other malaise: Secondary | ICD-10-CM | POA: Diagnosis not present

## 2018-03-27 DIAGNOSIS — R319 Hematuria, unspecified: Secondary | ICD-10-CM | POA: Diagnosis not present

## 2018-03-27 DIAGNOSIS — R509 Fever, unspecified: Secondary | ICD-10-CM | POA: Diagnosis not present

## 2018-03-27 DIAGNOSIS — R7881 Bacteremia: Secondary | ICD-10-CM | POA: Diagnosis not present

## 2018-03-27 DIAGNOSIS — N390 Urinary tract infection, site not specified: Secondary | ICD-10-CM | POA: Diagnosis not present

## 2018-03-27 DIAGNOSIS — B951 Streptococcus, group B, as the cause of diseases classified elsewhere: Secondary | ICD-10-CM | POA: Diagnosis not present

## 2018-03-27 DIAGNOSIS — R634 Abnormal weight loss: Secondary | ICD-10-CM | POA: Diagnosis not present

## 2018-03-27 DIAGNOSIS — Z86711 Personal history of pulmonary embolism: Secondary | ICD-10-CM | POA: Diagnosis not present

## 2018-03-27 DIAGNOSIS — E440 Moderate protein-calorie malnutrition: Secondary | ICD-10-CM | POA: Diagnosis not present

## 2018-03-27 DIAGNOSIS — Z7401 Bed confinement status: Secondary | ICD-10-CM | POA: Diagnosis not present

## 2018-03-27 DIAGNOSIS — R402441 Other coma, without documented Glasgow coma scale score, or with partial score reported, in the field [EMT or ambulance]: Secondary | ICD-10-CM | POA: Diagnosis not present

## 2018-03-27 DIAGNOSIS — D649 Anemia, unspecified: Secondary | ICD-10-CM | POA: Diagnosis not present

## 2018-04-04 DIAGNOSIS — N183 Chronic kidney disease, stage 3 (moderate): Secondary | ICD-10-CM | POA: Diagnosis not present

## 2018-04-04 DIAGNOSIS — I5032 Chronic diastolic (congestive) heart failure: Secondary | ICD-10-CM | POA: Diagnosis not present

## 2018-04-04 DIAGNOSIS — N390 Urinary tract infection, site not specified: Secondary | ICD-10-CM | POA: Diagnosis not present

## 2018-04-04 DIAGNOSIS — J449 Chronic obstructive pulmonary disease, unspecified: Secondary | ICD-10-CM | POA: Diagnosis not present

## 2018-04-11 DIAGNOSIS — R609 Edema, unspecified: Secondary | ICD-10-CM | POA: Diagnosis not present

## 2018-04-11 DIAGNOSIS — I5032 Chronic diastolic (congestive) heart failure: Secondary | ICD-10-CM | POA: Diagnosis not present

## 2018-04-15 DIAGNOSIS — I2699 Other pulmonary embolism without acute cor pulmonale: Secondary | ICD-10-CM | POA: Diagnosis not present

## 2018-04-15 DIAGNOSIS — H547 Unspecified visual loss: Secondary | ICD-10-CM | POA: Diagnosis not present

## 2018-04-15 DIAGNOSIS — J449 Chronic obstructive pulmonary disease, unspecified: Secondary | ICD-10-CM | POA: Diagnosis not present

## 2018-04-15 DIAGNOSIS — R29898 Other symptoms and signs involving the musculoskeletal system: Secondary | ICD-10-CM | POA: Diagnosis not present

## 2018-04-25 DIAGNOSIS — D638 Anemia in other chronic diseases classified elsewhere: Secondary | ICD-10-CM | POA: Diagnosis not present

## 2018-04-25 DIAGNOSIS — N183 Chronic kidney disease, stage 3 (moderate): Secondary | ICD-10-CM | POA: Diagnosis not present

## 2018-04-25 DIAGNOSIS — I5032 Chronic diastolic (congestive) heart failure: Secondary | ICD-10-CM | POA: Diagnosis not present

## 2018-04-25 DIAGNOSIS — J449 Chronic obstructive pulmonary disease, unspecified: Secondary | ICD-10-CM | POA: Diagnosis not present

## 2018-05-02 DIAGNOSIS — J449 Chronic obstructive pulmonary disease, unspecified: Secondary | ICD-10-CM | POA: Diagnosis not present

## 2018-05-07 DIAGNOSIS — D638 Anemia in other chronic diseases classified elsewhere: Secondary | ICD-10-CM | POA: Diagnosis not present

## 2018-05-07 DIAGNOSIS — I5032 Chronic diastolic (congestive) heart failure: Secondary | ICD-10-CM | POA: Diagnosis not present

## 2018-05-07 DIAGNOSIS — N183 Chronic kidney disease, stage 3 (moderate): Secondary | ICD-10-CM | POA: Diagnosis not present

## 2018-05-07 DIAGNOSIS — J449 Chronic obstructive pulmonary disease, unspecified: Secondary | ICD-10-CM | POA: Diagnosis not present

## 2018-05-23 DIAGNOSIS — J449 Chronic obstructive pulmonary disease, unspecified: Secondary | ICD-10-CM | POA: Diagnosis not present

## 2018-05-23 DIAGNOSIS — N183 Chronic kidney disease, stage 3 (moderate): Secondary | ICD-10-CM | POA: Diagnosis not present

## 2018-05-23 DIAGNOSIS — I5032 Chronic diastolic (congestive) heart failure: Secondary | ICD-10-CM | POA: Diagnosis not present

## 2018-05-23 DIAGNOSIS — D638 Anemia in other chronic diseases classified elsewhere: Secondary | ICD-10-CM | POA: Diagnosis not present

## 2018-06-02 DIAGNOSIS — J449 Chronic obstructive pulmonary disease, unspecified: Secondary | ICD-10-CM | POA: Diagnosis not present

## 2018-06-19 DIAGNOSIS — Z7901 Long term (current) use of anticoagulants: Secondary | ICD-10-CM | POA: Diagnosis not present

## 2018-06-19 DIAGNOSIS — H1711 Central corneal opacity, right eye: Secondary | ICD-10-CM | POA: Diagnosis not present

## 2018-06-19 DIAGNOSIS — H2702 Aphakia, left eye: Secondary | ICD-10-CM | POA: Diagnosis not present

## 2018-06-19 DIAGNOSIS — H353123 Nonexudative age-related macular degeneration, left eye, advanced atrophic without subfoveal involvement: Secondary | ICD-10-CM | POA: Diagnosis not present

## 2018-06-20 DIAGNOSIS — I5032 Chronic diastolic (congestive) heart failure: Secondary | ICD-10-CM | POA: Diagnosis not present

## 2018-06-20 DIAGNOSIS — J449 Chronic obstructive pulmonary disease, unspecified: Secondary | ICD-10-CM | POA: Diagnosis not present

## 2018-06-20 DIAGNOSIS — D638 Anemia in other chronic diseases classified elsewhere: Secondary | ICD-10-CM | POA: Diagnosis not present

## 2018-06-20 DIAGNOSIS — N183 Chronic kidney disease, stage 3 (moderate): Secondary | ICD-10-CM | POA: Diagnosis not present

## 2018-07-02 DIAGNOSIS — R278 Other lack of coordination: Secondary | ICD-10-CM | POA: Diagnosis not present

## 2018-07-02 DIAGNOSIS — R1312 Dysphagia, oropharyngeal phase: Secondary | ICD-10-CM | POA: Diagnosis not present

## 2018-07-02 DIAGNOSIS — M6281 Muscle weakness (generalized): Secondary | ICD-10-CM | POA: Diagnosis not present

## 2018-07-02 DIAGNOSIS — J189 Pneumonia, unspecified organism: Secondary | ICD-10-CM | POA: Diagnosis not present

## 2018-07-03 DIAGNOSIS — R1312 Dysphagia, oropharyngeal phase: Secondary | ICD-10-CM | POA: Diagnosis not present

## 2018-07-03 DIAGNOSIS — J189 Pneumonia, unspecified organism: Secondary | ICD-10-CM | POA: Diagnosis not present

## 2018-07-03 DIAGNOSIS — J449 Chronic obstructive pulmonary disease, unspecified: Secondary | ICD-10-CM | POA: Diagnosis not present

## 2018-07-03 DIAGNOSIS — R278 Other lack of coordination: Secondary | ICD-10-CM | POA: Diagnosis not present

## 2018-07-03 DIAGNOSIS — M6281 Muscle weakness (generalized): Secondary | ICD-10-CM | POA: Diagnosis not present

## 2018-07-04 DIAGNOSIS — J189 Pneumonia, unspecified organism: Secondary | ICD-10-CM | POA: Diagnosis not present

## 2018-07-04 DIAGNOSIS — M6281 Muscle weakness (generalized): Secondary | ICD-10-CM | POA: Diagnosis not present

## 2018-07-04 DIAGNOSIS — R278 Other lack of coordination: Secondary | ICD-10-CM | POA: Diagnosis not present

## 2018-07-04 DIAGNOSIS — R1312 Dysphagia, oropharyngeal phase: Secondary | ICD-10-CM | POA: Diagnosis not present

## 2018-07-05 DIAGNOSIS — J189 Pneumonia, unspecified organism: Secondary | ICD-10-CM | POA: Diagnosis not present

## 2018-07-05 DIAGNOSIS — M6281 Muscle weakness (generalized): Secondary | ICD-10-CM | POA: Diagnosis not present

## 2018-07-05 DIAGNOSIS — R278 Other lack of coordination: Secondary | ICD-10-CM | POA: Diagnosis not present

## 2018-07-05 DIAGNOSIS — R1312 Dysphagia, oropharyngeal phase: Secondary | ICD-10-CM | POA: Diagnosis not present

## 2018-07-06 DIAGNOSIS — R1312 Dysphagia, oropharyngeal phase: Secondary | ICD-10-CM | POA: Diagnosis not present

## 2018-07-06 DIAGNOSIS — M6281 Muscle weakness (generalized): Secondary | ICD-10-CM | POA: Diagnosis not present

## 2018-07-06 DIAGNOSIS — R278 Other lack of coordination: Secondary | ICD-10-CM | POA: Diagnosis not present

## 2018-07-06 DIAGNOSIS — J189 Pneumonia, unspecified organism: Secondary | ICD-10-CM | POA: Diagnosis not present

## 2018-07-09 DIAGNOSIS — R1312 Dysphagia, oropharyngeal phase: Secondary | ICD-10-CM | POA: Diagnosis not present

## 2018-07-09 DIAGNOSIS — J302 Other seasonal allergic rhinitis: Secondary | ICD-10-CM | POA: Diagnosis not present

## 2018-07-09 DIAGNOSIS — J189 Pneumonia, unspecified organism: Secondary | ICD-10-CM | POA: Diagnosis not present

## 2018-07-09 DIAGNOSIS — R609 Edema, unspecified: Secondary | ICD-10-CM | POA: Diagnosis not present

## 2018-07-09 DIAGNOSIS — R278 Other lack of coordination: Secondary | ICD-10-CM | POA: Diagnosis not present

## 2018-07-09 DIAGNOSIS — M6281 Muscle weakness (generalized): Secondary | ICD-10-CM | POA: Diagnosis not present

## 2018-07-10 DIAGNOSIS — M6281 Muscle weakness (generalized): Secondary | ICD-10-CM | POA: Diagnosis not present

## 2018-07-10 DIAGNOSIS — R278 Other lack of coordination: Secondary | ICD-10-CM | POA: Diagnosis not present

## 2018-07-10 DIAGNOSIS — R1312 Dysphagia, oropharyngeal phase: Secondary | ICD-10-CM | POA: Diagnosis not present

## 2018-07-10 DIAGNOSIS — J189 Pneumonia, unspecified organism: Secondary | ICD-10-CM | POA: Diagnosis not present

## 2018-07-11 DIAGNOSIS — R1312 Dysphagia, oropharyngeal phase: Secondary | ICD-10-CM | POA: Diagnosis not present

## 2018-07-11 DIAGNOSIS — R278 Other lack of coordination: Secondary | ICD-10-CM | POA: Diagnosis not present

## 2018-07-11 DIAGNOSIS — J189 Pneumonia, unspecified organism: Secondary | ICD-10-CM | POA: Diagnosis not present

## 2018-07-11 DIAGNOSIS — M6281 Muscle weakness (generalized): Secondary | ICD-10-CM | POA: Diagnosis not present

## 2018-07-12 DIAGNOSIS — J189 Pneumonia, unspecified organism: Secondary | ICD-10-CM | POA: Diagnosis not present

## 2018-07-12 DIAGNOSIS — R278 Other lack of coordination: Secondary | ICD-10-CM | POA: Diagnosis not present

## 2018-07-12 DIAGNOSIS — R1312 Dysphagia, oropharyngeal phase: Secondary | ICD-10-CM | POA: Diagnosis not present

## 2018-07-12 DIAGNOSIS — M6281 Muscle weakness (generalized): Secondary | ICD-10-CM | POA: Diagnosis not present

## 2018-07-14 DIAGNOSIS — R278 Other lack of coordination: Secondary | ICD-10-CM | POA: Diagnosis not present

## 2018-07-14 DIAGNOSIS — R1312 Dysphagia, oropharyngeal phase: Secondary | ICD-10-CM | POA: Diagnosis not present

## 2018-07-14 DIAGNOSIS — M6281 Muscle weakness (generalized): Secondary | ICD-10-CM | POA: Diagnosis not present

## 2018-07-14 DIAGNOSIS — J189 Pneumonia, unspecified organism: Secondary | ICD-10-CM | POA: Diagnosis not present

## 2018-07-16 DIAGNOSIS — M6281 Muscle weakness (generalized): Secondary | ICD-10-CM | POA: Diagnosis not present

## 2018-07-16 DIAGNOSIS — R1312 Dysphagia, oropharyngeal phase: Secondary | ICD-10-CM | POA: Diagnosis not present

## 2018-07-16 DIAGNOSIS — R278 Other lack of coordination: Secondary | ICD-10-CM | POA: Diagnosis not present

## 2018-07-16 DIAGNOSIS — I5032 Chronic diastolic (congestive) heart failure: Secondary | ICD-10-CM | POA: Diagnosis not present

## 2018-07-16 DIAGNOSIS — N183 Chronic kidney disease, stage 3 (moderate): Secondary | ICD-10-CM | POA: Diagnosis not present

## 2018-07-16 DIAGNOSIS — D638 Anemia in other chronic diseases classified elsewhere: Secondary | ICD-10-CM | POA: Diagnosis not present

## 2018-07-16 DIAGNOSIS — J189 Pneumonia, unspecified organism: Secondary | ICD-10-CM | POA: Diagnosis not present

## 2018-07-16 DIAGNOSIS — J449 Chronic obstructive pulmonary disease, unspecified: Secondary | ICD-10-CM | POA: Diagnosis not present

## 2018-07-17 DIAGNOSIS — R1312 Dysphagia, oropharyngeal phase: Secondary | ICD-10-CM | POA: Diagnosis not present

## 2018-07-17 DIAGNOSIS — J189 Pneumonia, unspecified organism: Secondary | ICD-10-CM | POA: Diagnosis not present

## 2018-07-17 DIAGNOSIS — M6281 Muscle weakness (generalized): Secondary | ICD-10-CM | POA: Diagnosis not present

## 2018-07-17 DIAGNOSIS — R278 Other lack of coordination: Secondary | ICD-10-CM | POA: Diagnosis not present

## 2018-07-18 DIAGNOSIS — R278 Other lack of coordination: Secondary | ICD-10-CM | POA: Diagnosis not present

## 2018-07-18 DIAGNOSIS — M6281 Muscle weakness (generalized): Secondary | ICD-10-CM | POA: Diagnosis not present

## 2018-07-18 DIAGNOSIS — R1312 Dysphagia, oropharyngeal phase: Secondary | ICD-10-CM | POA: Diagnosis not present

## 2018-07-18 DIAGNOSIS — J189 Pneumonia, unspecified organism: Secondary | ICD-10-CM | POA: Diagnosis not present

## 2018-07-19 DIAGNOSIS — R1312 Dysphagia, oropharyngeal phase: Secondary | ICD-10-CM | POA: Diagnosis not present

## 2018-07-19 DIAGNOSIS — R278 Other lack of coordination: Secondary | ICD-10-CM | POA: Diagnosis not present

## 2018-07-19 DIAGNOSIS — J189 Pneumonia, unspecified organism: Secondary | ICD-10-CM | POA: Diagnosis not present

## 2018-07-19 DIAGNOSIS — M6281 Muscle weakness (generalized): Secondary | ICD-10-CM | POA: Diagnosis not present

## 2018-07-20 DIAGNOSIS — M6281 Muscle weakness (generalized): Secondary | ICD-10-CM | POA: Diagnosis not present

## 2018-07-20 DIAGNOSIS — J189 Pneumonia, unspecified organism: Secondary | ICD-10-CM | POA: Diagnosis not present

## 2018-07-20 DIAGNOSIS — R278 Other lack of coordination: Secondary | ICD-10-CM | POA: Diagnosis not present

## 2018-07-20 DIAGNOSIS — R1312 Dysphagia, oropharyngeal phase: Secondary | ICD-10-CM | POA: Diagnosis not present

## 2018-07-23 DIAGNOSIS — J189 Pneumonia, unspecified organism: Secondary | ICD-10-CM | POA: Diagnosis not present

## 2018-07-23 DIAGNOSIS — R1312 Dysphagia, oropharyngeal phase: Secondary | ICD-10-CM | POA: Diagnosis not present

## 2018-07-23 DIAGNOSIS — R278 Other lack of coordination: Secondary | ICD-10-CM | POA: Diagnosis not present

## 2018-07-23 DIAGNOSIS — M6281 Muscle weakness (generalized): Secondary | ICD-10-CM | POA: Diagnosis not present

## 2018-07-24 DIAGNOSIS — M6281 Muscle weakness (generalized): Secondary | ICD-10-CM | POA: Diagnosis not present

## 2018-07-24 DIAGNOSIS — J189 Pneumonia, unspecified organism: Secondary | ICD-10-CM | POA: Diagnosis not present

## 2018-07-24 DIAGNOSIS — R278 Other lack of coordination: Secondary | ICD-10-CM | POA: Diagnosis not present

## 2018-07-25 DIAGNOSIS — I739 Peripheral vascular disease, unspecified: Secondary | ICD-10-CM | POA: Diagnosis not present

## 2018-07-25 DIAGNOSIS — J189 Pneumonia, unspecified organism: Secondary | ICD-10-CM | POA: Diagnosis not present

## 2018-07-25 DIAGNOSIS — B351 Tinea unguium: Secondary | ICD-10-CM | POA: Diagnosis not present

## 2018-07-25 DIAGNOSIS — R278 Other lack of coordination: Secondary | ICD-10-CM | POA: Diagnosis not present

## 2018-07-25 DIAGNOSIS — G8929 Other chronic pain: Secondary | ICD-10-CM | POA: Diagnosis not present

## 2018-07-25 DIAGNOSIS — M79675 Pain in left toe(s): Secondary | ICD-10-CM | POA: Diagnosis not present

## 2018-07-25 DIAGNOSIS — M6281 Muscle weakness (generalized): Secondary | ICD-10-CM | POA: Diagnosis not present

## 2018-07-26 DIAGNOSIS — R278 Other lack of coordination: Secondary | ICD-10-CM | POA: Diagnosis not present

## 2018-07-26 DIAGNOSIS — J189 Pneumonia, unspecified organism: Secondary | ICD-10-CM | POA: Diagnosis not present

## 2018-07-26 DIAGNOSIS — M6281 Muscle weakness (generalized): Secondary | ICD-10-CM | POA: Diagnosis not present

## 2018-07-27 DIAGNOSIS — M6281 Muscle weakness (generalized): Secondary | ICD-10-CM | POA: Diagnosis not present

## 2018-07-27 DIAGNOSIS — J189 Pneumonia, unspecified organism: Secondary | ICD-10-CM | POA: Diagnosis not present

## 2018-07-27 DIAGNOSIS — R278 Other lack of coordination: Secondary | ICD-10-CM | POA: Diagnosis not present

## 2018-07-30 DIAGNOSIS — R278 Other lack of coordination: Secondary | ICD-10-CM | POA: Diagnosis not present

## 2018-07-30 DIAGNOSIS — J189 Pneumonia, unspecified organism: Secondary | ICD-10-CM | POA: Diagnosis not present

## 2018-07-30 DIAGNOSIS — M6281 Muscle weakness (generalized): Secondary | ICD-10-CM | POA: Diagnosis not present

## 2018-07-31 DIAGNOSIS — J189 Pneumonia, unspecified organism: Secondary | ICD-10-CM | POA: Diagnosis not present

## 2018-07-31 DIAGNOSIS — M6281 Muscle weakness (generalized): Secondary | ICD-10-CM | POA: Diagnosis not present

## 2018-07-31 DIAGNOSIS — R278 Other lack of coordination: Secondary | ICD-10-CM | POA: Diagnosis not present

## 2018-08-02 DIAGNOSIS — J449 Chronic obstructive pulmonary disease, unspecified: Secondary | ICD-10-CM | POA: Diagnosis not present

## 2018-08-13 DIAGNOSIS — J449 Chronic obstructive pulmonary disease, unspecified: Secondary | ICD-10-CM | POA: Diagnosis not present

## 2018-08-13 DIAGNOSIS — N183 Chronic kidney disease, stage 3 (moderate): Secondary | ICD-10-CM | POA: Diagnosis not present

## 2018-08-13 DIAGNOSIS — I5032 Chronic diastolic (congestive) heart failure: Secondary | ICD-10-CM | POA: Diagnosis not present

## 2018-08-13 DIAGNOSIS — D638 Anemia in other chronic diseases classified elsewhere: Secondary | ICD-10-CM | POA: Diagnosis not present

## 2018-08-24 DIAGNOSIS — M79604 Pain in right leg: Secondary | ICD-10-CM | POA: Diagnosis not present

## 2018-08-24 DIAGNOSIS — M1711 Unilateral primary osteoarthritis, right knee: Secondary | ICD-10-CM | POA: Diagnosis not present

## 2018-08-24 DIAGNOSIS — R609 Edema, unspecified: Secondary | ICD-10-CM | POA: Diagnosis not present

## 2018-08-24 DIAGNOSIS — M79661 Pain in right lower leg: Secondary | ICD-10-CM | POA: Diagnosis not present

## 2018-09-02 DIAGNOSIS — J449 Chronic obstructive pulmonary disease, unspecified: Secondary | ICD-10-CM | POA: Diagnosis not present

## 2018-09-10 DIAGNOSIS — I5032 Chronic diastolic (congestive) heart failure: Secondary | ICD-10-CM | POA: Diagnosis not present

## 2018-09-10 DIAGNOSIS — D638 Anemia in other chronic diseases classified elsewhere: Secondary | ICD-10-CM | POA: Diagnosis not present

## 2018-09-10 DIAGNOSIS — N183 Chronic kidney disease, stage 3 (moderate): Secondary | ICD-10-CM | POA: Diagnosis not present

## 2018-09-10 DIAGNOSIS — J449 Chronic obstructive pulmonary disease, unspecified: Secondary | ICD-10-CM | POA: Diagnosis not present

## 2018-09-11 DIAGNOSIS — N183 Chronic kidney disease, stage 3 (moderate): Secondary | ICD-10-CM | POA: Diagnosis not present

## 2018-09-11 DIAGNOSIS — I959 Hypotension, unspecified: Secondary | ICD-10-CM | POA: Diagnosis not present

## 2018-09-14 DIAGNOSIS — I10 Essential (primary) hypertension: Secondary | ICD-10-CM | POA: Diagnosis not present

## 2018-09-14 DIAGNOSIS — D649 Anemia, unspecified: Secondary | ICD-10-CM | POA: Diagnosis not present

## 2018-09-21 DIAGNOSIS — E785 Hyperlipidemia, unspecified: Secondary | ICD-10-CM | POA: Diagnosis not present

## 2018-09-21 DIAGNOSIS — I10 Essential (primary) hypertension: Secondary | ICD-10-CM | POA: Diagnosis not present

## 2018-09-24 DIAGNOSIS — D649 Anemia, unspecified: Secondary | ICD-10-CM | POA: Diagnosis not present

## 2018-09-24 DIAGNOSIS — J9811 Atelectasis: Secondary | ICD-10-CM | POA: Diagnosis not present

## 2018-09-24 DIAGNOSIS — R0789 Other chest pain: Secondary | ICD-10-CM | POA: Diagnosis not present

## 2018-09-24 DIAGNOSIS — E876 Hypokalemia: Secondary | ICD-10-CM | POA: Diagnosis not present

## 2018-09-24 DIAGNOSIS — N289 Disorder of kidney and ureter, unspecified: Secondary | ICD-10-CM | POA: Diagnosis not present

## 2018-09-24 DIAGNOSIS — I9589 Other hypotension: Secondary | ICD-10-CM | POA: Diagnosis not present

## 2018-09-24 DIAGNOSIS — G909 Disorder of the autonomic nervous system, unspecified: Secondary | ICD-10-CM | POA: Diagnosis not present

## 2018-09-24 DIAGNOSIS — R5381 Other malaise: Secondary | ICD-10-CM | POA: Diagnosis not present

## 2018-09-24 DIAGNOSIS — R609 Edema, unspecified: Secondary | ICD-10-CM | POA: Diagnosis not present

## 2018-09-24 DIAGNOSIS — N184 Chronic kidney disease, stage 4 (severe): Secondary | ICD-10-CM | POA: Diagnosis not present

## 2018-09-24 DIAGNOSIS — E440 Moderate protein-calorie malnutrition: Secondary | ICD-10-CM | POA: Diagnosis not present

## 2018-09-24 DIAGNOSIS — I959 Hypotension, unspecified: Secondary | ICD-10-CM | POA: Diagnosis not present

## 2018-09-24 DIAGNOSIS — N179 Acute kidney failure, unspecified: Secondary | ICD-10-CM | POA: Diagnosis not present

## 2018-09-24 DIAGNOSIS — J449 Chronic obstructive pulmonary disease, unspecified: Secondary | ICD-10-CM | POA: Diagnosis not present

## 2018-09-24 DIAGNOSIS — N189 Chronic kidney disease, unspecified: Secondary | ICD-10-CM | POA: Diagnosis not present

## 2018-09-24 DIAGNOSIS — Z7401 Bed confinement status: Secondary | ICD-10-CM | POA: Diagnosis not present

## 2018-09-24 DIAGNOSIS — Z86711 Personal history of pulmonary embolism: Secondary | ICD-10-CM | POA: Diagnosis not present

## 2018-09-24 DIAGNOSIS — R601 Generalized edema: Secondary | ICD-10-CM | POA: Diagnosis not present

## 2018-09-24 DIAGNOSIS — E875 Hyperkalemia: Secondary | ICD-10-CM | POA: Diagnosis not present

## 2018-09-24 DIAGNOSIS — R0902 Hypoxemia: Secondary | ICD-10-CM | POA: Diagnosis not present

## 2018-09-24 DIAGNOSIS — E871 Hypo-osmolality and hyponatremia: Secondary | ICD-10-CM | POA: Diagnosis not present

## 2018-10-02 DIAGNOSIS — J449 Chronic obstructive pulmonary disease, unspecified: Secondary | ICD-10-CM | POA: Diagnosis not present

## 2018-10-03 DIAGNOSIS — I5032 Chronic diastolic (congestive) heart failure: Secondary | ICD-10-CM | POA: Diagnosis not present

## 2018-10-03 DIAGNOSIS — N183 Chronic kidney disease, stage 3 (moderate): Secondary | ICD-10-CM | POA: Diagnosis not present

## 2018-10-03 DIAGNOSIS — J189 Pneumonia, unspecified organism: Secondary | ICD-10-CM | POA: Diagnosis not present

## 2018-10-03 DIAGNOSIS — J449 Chronic obstructive pulmonary disease, unspecified: Secondary | ICD-10-CM | POA: Diagnosis not present

## 2018-10-08 DIAGNOSIS — R609 Edema, unspecified: Secondary | ICD-10-CM | POA: Diagnosis not present

## 2018-10-08 DIAGNOSIS — I5032 Chronic diastolic (congestive) heart failure: Secondary | ICD-10-CM | POA: Diagnosis not present

## 2018-10-09 DIAGNOSIS — I5032 Chronic diastolic (congestive) heart failure: Secondary | ICD-10-CM | POA: Diagnosis not present

## 2018-10-09 DIAGNOSIS — N184 Chronic kidney disease, stage 4 (severe): Secondary | ICD-10-CM | POA: Diagnosis not present

## 2018-10-09 DIAGNOSIS — I951 Orthostatic hypotension: Secondary | ICD-10-CM | POA: Diagnosis not present

## 2018-10-10 DIAGNOSIS — R0689 Other abnormalities of breathing: Secondary | ICD-10-CM | POA: Diagnosis not present

## 2018-10-10 DIAGNOSIS — I499 Cardiac arrhythmia, unspecified: Secondary | ICD-10-CM | POA: Diagnosis not present

## 2018-10-10 DIAGNOSIS — R404 Transient alteration of awareness: Secondary | ICD-10-CM | POA: Diagnosis not present

## 2018-10-10 DIAGNOSIS — R609 Edema, unspecified: Secondary | ICD-10-CM | POA: Diagnosis not present

## 2018-10-10 DIAGNOSIS — I5032 Chronic diastolic (congestive) heart failure: Secondary | ICD-10-CM | POA: Diagnosis not present

## 2018-10-10 DIAGNOSIS — R0902 Hypoxemia: Secondary | ICD-10-CM | POA: Diagnosis not present

## 2018-10-24 DIAGNOSIS — 419620001 Death: Secondary | SNOMED CT | POA: Diagnosis not present

## 2018-10-24 DEATH — deceased
